# Patient Record
Sex: Female | Born: 1985 | Race: White | Hispanic: No | Marital: Married | Smoking: Never smoker
Health system: Southern US, Community
[De-identification: ages and names within clinical notes are randomized; demographics above are authoritative.]

## PROBLEM LIST (undated history)

## (undated) ENCOUNTER — Inpatient Hospital Stay (HOSPITAL_COMMUNITY): Payer: Self-pay

## (undated) DIAGNOSIS — J189 Pneumonia, unspecified organism: Secondary | ICD-10-CM

## (undated) DIAGNOSIS — Z8709 Personal history of other diseases of the respiratory system: Secondary | ICD-10-CM

## (undated) DIAGNOSIS — K219 Gastro-esophageal reflux disease without esophagitis: Secondary | ICD-10-CM

## (undated) DIAGNOSIS — Z8619 Personal history of other infectious and parasitic diseases: Secondary | ICD-10-CM

## (undated) HISTORY — DX: Personal history of other diseases of the respiratory system: Z87.09

## (undated) HISTORY — DX: Personal history of other infectious and parasitic diseases: Z86.19

---
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
## 2008-12-14 ENCOUNTER — Ambulatory Visit: Payer: Self-pay | Admitting: Obstetrics & Gynecology

## 2008-12-14 ENCOUNTER — Encounter: Payer: Self-pay | Admitting: Obstetrics & Gynecology

## 2009-09-07 ENCOUNTER — Ambulatory Visit: Payer: Self-pay | Admitting: Obstetrics and Gynecology

## 2009-11-29 ENCOUNTER — Ambulatory Visit: Payer: Self-pay | Admitting: Family Medicine

## 2009-11-30 ENCOUNTER — Encounter: Payer: Self-pay | Admitting: Family Medicine

## 2009-11-30 LAB — CONVERTED CEMR LAB: Clue Cells Wet Prep HPF POC: NONE SEEN

## 2010-01-23 ENCOUNTER — Ambulatory Visit: Payer: Self-pay | Admitting: Obstetrics and Gynecology

## 2010-02-19 ENCOUNTER — Ambulatory Visit: Payer: Self-pay | Admitting: Family Medicine

## 2011-03-12 NOTE — Assessment & Plan Note (Signed)
NAME:  [name], AGRO NO.:  [id number]   MEDICAL RECORD NO.:  [id number]          PATIENT TYPE:  POB   LOCATION:  [facility] at [facility]         FACILITY:  [facility]   PHYSICIAN:  Catalina Antigua, MD     DATE OF BIRTH:  04-12-1986   DATE OF SERVICE:  09/07/2009                                  CLINIC NOTE   This is a 25 year old, gravida 0 with LMP of October of 16, 2010, who  presents for evaluation of left breast tenderness since Monday.  The  patient denies any palpable masses or nipple discharge.  There was some  left breast tenderness that she noticed on Monday.  The patient is  currently using Ortho Tri-Cyclen lo for birth control and is at the end  of her pack for active pills.  The patient has a family history  significant for diabetes and high blood pressure, but denies any history  of breast or ovarian cancer or colon cancer in her family.   PHYSICAL EXAMINATION:  VITAL SIGNS:  Her blood pressure is 114/85, pulse  of 75, weight of 125 pounds, height of 5 feet 0 inches.  BREASTS:  Equal in size.  No palpable masses or tenderness.  No palpable  lymphadenopathy.  No expressible nipple discharge.  The patient pointed  the pain to be localized at the 7-9 o'clock region and on her left  breast in that particular region again no tenderness or palpable masses  were appreciated.   ASSESSMENT AND PLAN:  This is a 25 year old, gravida 0, who presents for  evaluation of left breast tenderness.  The patient was advised that her  pain may be due to her completing her pack of birth control pills.  The  patient is advised to return for evaluation after her period if the  breast tenderness is persistent.  The patient will otherwise followup in  February for annual exam.           ______________________________  Catalina Antigua, MD     PC/MEDQ  D:  09/07/2009  T:  09/08/2009  Job:  [id number]

## 2011-03-12 NOTE — Assessment & Plan Note (Signed)
NAME:  [name], TEAS NO.:  [id number]   MEDICAL RECORD NO.:  [id number]          PATIENT TYPE:  POB   LOCATION:  [facility] at [facility]         FACILITY:  [facility]   PHYSICIAN:  Tinnie Gens, MD        DATE OF BIRTH:  02/17/1986   DATE OF SERVICE:  11/29/2009                                  CLINIC NOTE   CHIEF COMPLAINT:  Vaginal discharge or itching.   HISTORY OF PRESENT ILLNESS:  The patient is a 25 year old nullipara, who  has recently married.  She has been on antibiotics for a sinus  infection.  She developed abnormal discharge and some vaginal irritation  and she started Monistat, it burned exceedingly.  It burns very badly,  so she stopped taking that medicine.  She thought maybe she had a UTI  and began taking cranberry juice and cranberry pills.  She continues to  have abdominal cramping, an abnormal discharge, and some burning with  urination, and she reports her urine has some odor in it.  She does not  do, should take baths, she denies back pain, fever, chills, nausea, or  vomiting.   PHYSICAL EXAMINATION:  VITAL SIGNS:  Today, her vitals are as in the  chart.  GENERAL:  She is a well-developed, well-nourished female, in no acute  distress.  ABDOMEN:  Soft, no suprapubic tenderness noted.  BACK:  Negative for CVA tenderness.  GU:  Normal external female genitalia.  BUS is normal.  Vagina is pink  and rugated.  Cervix is nulliparous.  There is a yellow discharge noted.  This discharge is thin and liquid without clumps.  She has no cervical  motion tenderness.  Uterus is small and anteverted.  No adnexal masses  or tenderness.   IMPRESSION:  Abnormal discharge likely bacterial vaginitis.   PLAN:  1. Treat with Flagyl 500 mg p.o. b.i.d.  Advised no alcohol during      this time.  UA shows trace amount of blood, we will send for      culture.  If this comes back possible, we call this in for her.  2. Send wet prep.     ______________________________  Tinnie Gens, MD     TP/MEDQ  D:  11/29/2009  T:  11/29/2009  Job:  [id number]

## 2011-03-12 NOTE — Assessment & Plan Note (Signed)
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  All tests at  that time were normal.  She is in for annual today.  She has no  significant complaints except an area where she said she saw a  gynecologist in [place] one time that they told her she had a cyst  that was painful in the lower portion of her vagina, mainly when she  walks.  Other than that, the review of systems is negative.   PHYSICAL EXAMINATION:  GENERAL:  Well-developed, well-nourished white  female, in no acute distress.  VITAL SIGNS:  Weight 141, 5 feet tall, blood pressure 115/89, pulse 91  per minute.  HEENT:  Within normal limits.  NECK:  Supple.  Thyroid symmetrical, no masses.  LUNGS:  Clear to auscultation and percussion.  HEART:  No murmur.  Normal sinus rhythm.  BREASTS:  Symmetrical, no dominant masses.  No nipple discharge.  No  supraclavicular or axillary nodes.  ABDOMEN:  Soft, flat, nontender.  No masses, no organomegaly.  GU:  External genitalia is normal.  BUS within normal limits.  No  palpable Bartholin cyst could be noted.  No swelling of the labia and no  point tenderness where the patient is describing the pain.  Vagina is  clean, well rugated.  Cervix is clean with a small ectropion.  The  uterus is anterior of normal size, shape, consistency, and adnexa is  normal.   IMPRESSION:  Normal gynecological examination.           ______________________________  Argentina Donovan, MD     PR/MEDQ  D:  01/23/2010  T:  01/24/2010  Job:  [id number]

## 2011-03-12 NOTE — Assessment & Plan Note (Signed)
NAMEDEBBE, [name] NO.:  [id number]   MEDICAL RECORD NO.:  [id number]          PATIENT TYPE:  POB   LOCATION:  [facility] at [facility]         FACILITY:  [facility]   PHYSICIAN:  Johnella Moloney, MD        DATE OF BIRTH:  [birth year]   DATE OF SERVICE:  12/14/2008                                  CLINIC NOTE   Patient is a 25 year old gravida 0 with last menstrual period of  December 07, 2008, who is here for annual exam.  Patient has no  gynecologic concerns.  She denies any abnormal bleeding, vaginal  discharge ,or problems with sexual intercourse.  Of note, patient is  going to get married on April 01, 2009.  She is on Ortho Tri-Cyclen Lo for  birth control and desires a refill of this medication.   PAST MEDICAL HISTORY:  Patient had pneumonia as a child for which she  was hospitalized.   PAST SURGICAL HISTORY:  None.   PAST GYN HISTORY:  Patient had menarche at age 55.  Her cycles are  regular with 28 days between cycles and her periods last for 5 days,  characterized by heavy flow.  It is sometimes associated with moderate  pain.  She denies any intermenstrual bleeding.  Patient is currently on  Ortho Tri-Cyclen Lo for birth control.  Her last Pap smear was in  November 2008 which was normal.  She has never had an abnormal Pap  smear.   MEDICATIONS:  Ortho Tri-Cyclen Lo.   ALLERGIES:  MENINGITIS VACCINE AND PATIENT REPORTS THAT SHE HAD AN  ANAPHYLACTIC REACTION TO THIS VACCINATION.   SOCIAL HISTORY:  Patient lives with her fiance.  She is currently  unemployed.  She drinks 1 to 2 alcoholic drinks per week.  She does not  smoke and she does not use any IV or addicting drugs.  She denies any  past or current history of sexual physical abuse.   FAMILIAL HISTORY:  Remarkable for diabetes and high blood pressure.  She  denies any breast, gynecologic, or colon cancer history.   REVIEW OF SYSTEMS:  Comprehensive 14-point review of systems was  reviewed and was  entirely negative.   PHYSICAL EXAMINATION:  Blood pressure 107/79.  Pulse 85.  Weight 139  pounds.  Height 5 feet.  GENERAL:  No apparent distress.  HEENT:  Normocephalic, atraumatic.  Normal thyroid.  No abnormal neck  masses.  LUNGS:  Clear to auscultation bilaterally.  HEART:  Regular rate and rhythm.  BREASTS:  Symmetric in size, nontender.  No abnormal masses, skin  changes, drainage, or lymphadenopathy palpated.  ABDOMEN:  Soft, nontender, nondistended.  EXTREMITIES:  No cyanosis, clubbing, or edema.  PELVIC:  Normal external female genitalia.  Pink, well-rugated vagina.  Normal cervix contour.  Pap smear was obtained.  On bimanual exam,  uterus is mobile, normal size, nontender.  Adnexa normal bilaterally  without tenderness to palpation.   ASSESSMENT AND PLAN:  Patient is a 25 year old gravida 0 here for her  annual gynecologic exam and Pap smear.  She had a normal breast exam  today and a Pap smear was obtained.  Of note, gonorrhea and  chlamydia  will be run off of the Pap smear sample.  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  She has said that her former gynecologist also talked to  her about this.  Patient will be contacted with the Pap smear results  and she was given best wishes for her upcoming wedding in June and told  to follow up for any gynecologic concerns.           ______________________________  Johnella Moloney, MD     UD/MEDQ  D:  12/14/2008  T:  12/14/2008  Job:  [id number]

## 2011-05-23 ENCOUNTER — Other Ambulatory Visit: Payer: Self-pay | Admitting: Obstetrics and Gynecology

## 2011-05-30 LAB — HIV ANTIBODY (ROUTINE TESTING W REFLEX): HIV: NONREACTIVE

## 2011-05-30 LAB — RPR: RPR: NONREACTIVE

## 2011-05-30 LAB — HEPATITIS B SURFACE ANTIGEN: Hepatitis B Surface Ag: NEGATIVE

## 2011-05-30 LAB — ABO/RH

## 2011-05-30 LAB — GC/CHLAMYDIA PROBE AMP, GENITAL: Gonorrhea: NEGATIVE

## 2011-05-30 LAB — ANTIBODY SCREEN: Antibody Screen: NEGATIVE

## 2011-12-27 LAB — STREP B DNA PROBE: GBS: NEGATIVE

## 2011-12-27 LAB — CULTURE, BETA STREP (GROUP B ONLY): Organism ID, Bacteria: NEGATIVE

## 2012-01-23 ENCOUNTER — Encounter (HOSPITAL_COMMUNITY): Payer: Self-pay | Admitting: Anesthesiology

## 2012-01-23 ENCOUNTER — Encounter (HOSPITAL_COMMUNITY)
Admission: AD | Disposition: A | Discharge status: Home / Self Care | Payer: Self-pay | Source: Ambulatory Visit | Attending: Obstetrics and Gynecology

## 2012-01-23 ENCOUNTER — Inpatient Hospital Stay (HOSPITAL_COMMUNITY): Payer: BC Managed Care – PPO | Admitting: Anesthesiology

## 2012-01-23 ENCOUNTER — Inpatient Hospital Stay (HOSPITAL_COMMUNITY)
Admission: AD | Admit: 2012-01-23 | Discharge: 2012-01-26 | DRG: 371 | Disposition: A | Discharge status: Home / Self Care | Payer: BC Managed Care – PPO | Source: Ambulatory Visit | Attending: Obstetrics and Gynecology | Admitting: Obstetrics and Gynecology

## 2012-01-23 ENCOUNTER — Encounter (HOSPITAL_COMMUNITY): Payer: Self-pay | Admitting: *Deleted

## 2012-01-23 DIAGNOSIS — O324XX0 Maternal care for high head at term, not applicable or unspecified: Secondary | ICD-10-CM | POA: Diagnosis present

## 2012-01-23 DIAGNOSIS — O480 Post-term pregnancy: Principal | ICD-10-CM | POA: Diagnosis present

## 2012-01-23 HISTORY — DX: Gastro-esophageal reflux disease without esophagitis: K21.9

## 2012-01-23 HISTORY — DX: Pneumonia, unspecified organism: J18.9

## 2012-01-23 LAB — CBC
HCT: 41.7 % (ref 36.0–46.0)
MCV: 94.3 fL (ref 78.0–100.0)
Platelets: 191 10*3/uL (ref 150–400)
RBC: 4.42 MIL/uL (ref 3.87–5.11)
WBC: 11.3 10*3/uL — ABNORMAL HIGH (ref 4.0–10.5)

## 2012-01-23 SURGERY — Surgical Case
Anesthesia: Regional

## 2012-01-23 MED ORDER — DIPHENHYDRAMINE HCL 12.5 MG/5ML PO ELIX
12.5000 mg | ORAL_SOLUTION | Freq: Four times a day (QID) | ORAL | Status: DC | PRN
  Filled 2012-01-23: qty 5

## 2012-01-23 MED ORDER — PHENYLEPHRINE 40 MCG/ML (10ML) SYRINGE FOR IV PUSH (FOR BLOOD PRESSURE SUPPORT): 80.0000 ug | PREFILLED_SYRINGE | INTRAVENOUS | Status: DC | PRN

## 2012-01-23 MED ORDER — MORPHINE SULFATE (PF) 0.5 MG/ML IJ SOLN
INTRAMUSCULAR | Status: DC | PRN
  Administered 2012-01-23: 3 mg via EPIDURAL

## 2012-01-23 MED ORDER — CHLOROPROCAINE HCL 3 % IJ SOLN
INTRAMUSCULAR | Status: DC | PRN
  Administered 2012-01-23: 20 mL

## 2012-01-23 MED ORDER — ONDANSETRON HCL 4 MG/2ML IJ SOLN: 4.0000 mg | Freq: Four times a day (QID) | INTRAMUSCULAR | Status: DC | PRN

## 2012-01-23 MED ORDER — ONDANSETRON HCL 4 MG PO TABS: 4.0000 mg | ORAL_TABLET | ORAL | Status: DC | PRN

## 2012-01-23 MED ORDER — EPHEDRINE 5 MG/ML INJ
INTRAVENOUS | Status: AC
  Filled 2012-01-23: qty 10

## 2012-01-23 MED ORDER — SODIUM CHLORIDE 0.9 % IJ SOLN
3.0000 mL | INTRAMUSCULAR | Status: DC | PRN
  Administered 2012-01-24: 3 mL via INTRAVENOUS

## 2012-01-23 MED ORDER — FENTANYL CITRATE 0.05 MG/ML IJ SOLN: 25.0000 ug | INTRAMUSCULAR | Status: DC | PRN

## 2012-01-23 MED ORDER — OXYTOCIN 10 UNIT/ML IJ SOLN
INTRAMUSCULAR | Status: AC
  Filled 2012-01-23: qty 2

## 2012-01-23 MED ORDER — KETOROLAC TROMETHAMINE 30 MG/ML IJ SOLN: 30.0000 mg | Freq: Four times a day (QID) | INTRAMUSCULAR | Status: AC | PRN

## 2012-01-23 MED ORDER — MIDAZOLAM HCL 2 MG/2ML IJ SOLN: 0.5000 mg | Freq: Once | INTRAMUSCULAR | Status: DC | PRN

## 2012-01-23 MED ORDER — TERBUTALINE SULFATE 1 MG/ML IJ SOLN: 0.2500 mg | Freq: Once | INTRAMUSCULAR | Status: DC | PRN

## 2012-01-23 MED ORDER — TETANUS-DIPHTH-ACELL PERTUSSIS 5-2.5-18.5 LF-MCG/0.5 IM SUSP: 0.5000 mL | Freq: Once | INTRAMUSCULAR | Status: DC

## 2012-01-23 MED ORDER — LACTATED RINGERS IV SOLN
INTRAVENOUS | Status: DC
  Administered 2012-01-24 (×2): via INTRAVENOUS

## 2012-01-23 MED ORDER — METOCLOPRAMIDE HCL 5 MG/ML IJ SOLN: 10.0000 mg | Freq: Three times a day (TID) | INTRAMUSCULAR | Status: DC | PRN

## 2012-01-23 MED ORDER — LIDOCAINE-EPINEPHRINE (PF) 2 %-1:200000 IJ SOLN
INTRAMUSCULAR | Status: AC
  Filled 2012-01-23: qty 20

## 2012-01-23 MED ORDER — NALOXONE HCL 0.4 MG/ML IJ SOLN: 0.4000 mg | INTRAMUSCULAR | Status: DC | PRN

## 2012-01-23 MED ORDER — SIMETHICONE 80 MG PO CHEW
80.0000 mg | CHEWABLE_TABLET | Freq: Three times a day (TID) | ORAL | Status: DC
  Administered 2012-01-24 – 2012-01-26 (×9): 80 mg via ORAL

## 2012-01-23 MED ORDER — FLEET ENEMA 7-19 GM/118ML RE ENEM: 1.0000 | ENEMA | RECTAL | Status: DC | PRN

## 2012-01-23 MED ORDER — DIPHENHYDRAMINE HCL 25 MG PO CAPS: 25.0000 mg | ORAL_CAPSULE | Freq: Four times a day (QID) | ORAL | Status: DC | PRN

## 2012-01-23 MED ORDER — MENTHOL 3 MG MT LOZG: 1.0000 | LOZENGE | OROMUCOSAL | Status: DC | PRN

## 2012-01-23 MED ORDER — NALBUPHINE HCL 10 MG/ML IJ SOLN: 5.0000 mg | INTRAMUSCULAR | Status: DC | PRN

## 2012-01-23 MED ORDER — SODIUM CHLORIDE 0.9 % IV SOLN: 1.0000 ug/kg/h | INTRAVENOUS | Status: DC | PRN

## 2012-01-23 MED ORDER — ONDANSETRON HCL 4 MG/2ML IJ SOLN
INTRAMUSCULAR | Status: AC
  Filled 2012-01-23: qty 2

## 2012-01-23 MED ORDER — SIMETHICONE 80 MG PO CHEW: 80.0000 mg | CHEWABLE_TABLET | ORAL | Status: DC | PRN

## 2012-01-23 MED ORDER — CHLOROPROCAINE HCL 3 % IJ SOLN
INTRAMUSCULAR | Status: AC
  Filled 2012-01-23: qty 20

## 2012-01-23 MED ORDER — PHENYLEPHRINE HCL 10 MG/ML IJ SOLN
INTRAMUSCULAR | Status: DC | PRN
  Administered 2012-01-23 (×2): 40 ug via INTRAVENOUS
  Administered 2012-01-23: 60 ug via INTRAVENOUS

## 2012-01-23 MED ORDER — DIPHENHYDRAMINE HCL 25 MG PO CAPS: 25.0000 mg | ORAL_CAPSULE | ORAL | Status: DC | PRN

## 2012-01-23 MED ORDER — FENTANYL 2.5 MCG/ML BUPIVACAINE 1/10 % EPIDURAL INFUSION (WH - ANES)
INTRAMUSCULAR | Status: DC | PRN
  Administered 2012-01-23: 14 mL/h via EPIDURAL

## 2012-01-23 MED ORDER — WITCH HAZEL-GLYCERIN EX PADS: 1.0000 "application " | MEDICATED_PAD | CUTANEOUS | Status: DC | PRN

## 2012-01-23 MED ORDER — ZOLPIDEM TARTRATE 5 MG PO TABS: 5.0000 mg | ORAL_TABLET | Freq: Every evening | ORAL | Status: DC | PRN

## 2012-01-23 MED ORDER — LANOLIN HYDROUS EX OINT: 1.0000 "application " | TOPICAL_OINTMENT | CUTANEOUS | Status: DC | PRN

## 2012-01-23 MED ORDER — OXYTOCIN 20 UNITS IN LACTATED RINGERS INFUSION - SIMPLE
1.0000 m[IU]/min | INTRAVENOUS | Status: DC
  Administered 2012-01-23: 2 m[IU]/min via INTRAVENOUS
  Filled 2012-01-23: qty 1000

## 2012-01-23 MED ORDER — PHENYLEPHRINE 40 MCG/ML (10ML) SYRINGE FOR IV PUSH (FOR BLOOD PRESSURE SUPPORT)
PREFILLED_SYRINGE | INTRAVENOUS | Status: AC
  Filled 2012-01-23: qty 5

## 2012-01-23 MED ORDER — IBUPROFEN 600 MG PO TABS
600.0000 mg | ORAL_TABLET | Freq: Four times a day (QID) | ORAL | Status: DC
  Administered 2012-01-24 – 2012-01-26 (×9): 600 mg via ORAL
  Filled 2012-01-23 (×8): qty 1

## 2012-01-23 MED ORDER — CITRIC ACID-SODIUM CITRATE 334-500 MG/5ML PO SOLN
30.0000 mL | ORAL | Status: DC | PRN
  Administered 2012-01-23: 30 mL via ORAL
  Filled 2012-01-23: qty 15

## 2012-01-23 MED ORDER — MEPERIDINE HCL 25 MG/ML IJ SOLN
INTRAMUSCULAR | Status: DC | PRN
  Administered 2012-01-23: 25 mg via INTRAVENOUS

## 2012-01-23 MED ORDER — KETOROLAC TROMETHAMINE 30 MG/ML IJ SOLN
INTRAMUSCULAR | Status: AC
  Filled 2012-01-23: qty 1

## 2012-01-23 MED ORDER — MEPERIDINE HCL 25 MG/ML IJ SOLN
INTRAMUSCULAR | Status: AC
  Filled 2012-01-23: qty 1

## 2012-01-23 MED ORDER — PROMETHAZINE HCL 25 MG/ML IJ SOLN: 6.2500 mg | INTRAMUSCULAR | Status: DC | PRN

## 2012-01-23 MED ORDER — SODIUM BICARBONATE 8.4 % IV SOLN
INTRAVENOUS | Status: DC | PRN
  Administered 2012-01-23: 4 mL via EPIDURAL

## 2012-01-23 MED ORDER — ACETAMINOPHEN 325 MG PO TABS: 325.0000 mg | ORAL_TABLET | ORAL | Status: DC | PRN

## 2012-01-23 MED ORDER — DIPHENHYDRAMINE HCL 50 MG/ML IJ SOLN: 12.5000 mg | INTRAMUSCULAR | Status: DC | PRN

## 2012-01-23 MED ORDER — IBUPROFEN 600 MG PO TABS: 600.0000 mg | ORAL_TABLET | Freq: Four times a day (QID) | ORAL | Status: DC | PRN

## 2012-01-23 MED ORDER — MEPERIDINE HCL 25 MG/ML IJ SOLN: 6.2500 mg | INTRAMUSCULAR | Status: DC | PRN

## 2012-01-23 MED ORDER — MORPHINE SULFATE (PF) 0.5 MG/ML IJ SOLN
INTRAMUSCULAR | Status: DC | PRN
  Administered 2012-01-23: 2 mg via EPIDURAL

## 2012-01-23 MED ORDER — DIBUCAINE 1 % RE OINT: 1.0000 "application " | TOPICAL_OINTMENT | RECTAL | Status: DC | PRN

## 2012-01-23 MED ORDER — OXYTOCIN 20 UNITS IN LACTATED RINGERS INFUSION - SIMPLE: 125.0000 mL/h | Freq: Once | INTRAVENOUS | Status: DC

## 2012-01-23 MED ORDER — MORPHINE SULFATE 0.5 MG/ML IJ SOLN
INTRAMUSCULAR | Status: AC
  Filled 2012-01-23: qty 10

## 2012-01-23 MED ORDER — OXYCODONE-ACETAMINOPHEN 5-325 MG PO TABS
1.0000 | ORAL_TABLET | ORAL | Status: DC | PRN
  Administered 2012-01-24 (×2): 2 via ORAL
  Administered 2012-01-24: 1 via ORAL
  Administered 2012-01-25 (×2): 2 via ORAL
  Administered 2012-01-26: 1 via ORAL
  Administered 2012-01-26: 2 via ORAL
  Administered 2012-01-26: 1 via ORAL
  Filled 2012-01-23 (×2): qty 2
  Filled 2012-01-23: qty 1
  Filled 2012-01-23 (×3): qty 2
  Filled 2012-01-23 (×2): qty 1

## 2012-01-23 MED ORDER — OXYTOCIN 20 UNITS IN LACTATED RINGERS INFUSION - SIMPLE: 125.0000 mL/h | INTRAVENOUS | Status: AC

## 2012-01-23 MED ORDER — SCOPOLAMINE 1 MG/3DAYS TD PT72
1.0000 | MEDICATED_PATCH | Freq: Once | TRANSDERMAL | Status: DC
  Administered 2012-01-23: 1.5 mg via TRANSDERMAL

## 2012-01-23 MED ORDER — LIDOCAINE HCL (PF) 1 % IJ SOLN: 30.0000 mL | INTRAMUSCULAR | Status: DC | PRN

## 2012-01-23 MED ORDER — LACTATED RINGERS IV SOLN
INTRAVENOUS | Status: DC | PRN
  Administered 2012-01-23: 20:00:00 via INTRAVENOUS

## 2012-01-23 MED ORDER — FENTANYL 2.5 MCG/ML BUPIVACAINE 1/10 % EPIDURAL INFUSION (WH - ANES)
14.0000 mL/h | INTRAMUSCULAR | Status: DC
  Administered 2012-01-23: 14 mL/h via EPIDURAL
  Filled 2012-01-23 (×2): qty 60

## 2012-01-23 MED ORDER — ONDANSETRON HCL 4 MG/2ML IJ SOLN: 4.0000 mg | Freq: Three times a day (TID) | INTRAMUSCULAR | Status: DC | PRN

## 2012-01-23 MED ORDER — ACETAMINOPHEN 325 MG PO TABS: 650.0000 mg | ORAL_TABLET | ORAL | Status: DC | PRN

## 2012-01-23 MED ORDER — EPHEDRINE 5 MG/ML INJ
10.0000 mg | INTRAVENOUS | Status: DC | PRN
  Filled 2012-01-23: qty 4

## 2012-01-23 MED ORDER — DIPHENHYDRAMINE HCL 50 MG/ML IJ SOLN: 25.0000 mg | INTRAMUSCULAR | Status: DC | PRN

## 2012-01-23 MED ORDER — OXYCODONE-ACETAMINOPHEN 5-325 MG PO TABS: 1.0000 | ORAL_TABLET | ORAL | Status: DC | PRN

## 2012-01-23 MED ORDER — OXYTOCIN 20 UNITS IN LACTATED RINGERS INFUSION - SIMPLE
INTRAVENOUS | Status: DC | PRN
  Administered 2012-01-23 (×2): 20 [IU] via INTRAVENOUS

## 2012-01-23 MED ORDER — ONDANSETRON HCL 4 MG/2ML IJ SOLN: 4.0000 mg | INTRAMUSCULAR | Status: DC | PRN

## 2012-01-23 MED ORDER — LACTATED RINGERS IV SOLN
500.0000 mL | INTRAVENOUS | Status: DC | PRN
  Administered 2012-01-23: 1000 mL via INTRAVENOUS

## 2012-01-23 MED ORDER — ONDANSETRON HCL 4 MG/2ML IJ SOLN
INTRAMUSCULAR | Status: DC | PRN
  Administered 2012-01-23: 4 mg via INTRAVENOUS

## 2012-01-23 MED ORDER — CEFAZOLIN SODIUM-DEXTROSE 2-3 GM-% IV SOLR
2.0000 g | Freq: Once | INTRAVENOUS | Status: AC
  Administered 2012-01-23: 2 g via INTRAVENOUS
  Filled 2012-01-23: qty 50

## 2012-01-23 MED ORDER — LACTATED RINGERS IV SOLN: 500.0000 mL | Freq: Once | INTRAVENOUS | Status: DC

## 2012-01-23 MED ORDER — LACTATED RINGERS IV SOLN
INTRAVENOUS | Status: DC
  Administered 2012-01-23 (×3): via INTRAVENOUS

## 2012-01-23 MED ORDER — SCOPOLAMINE 1 MG/3DAYS TD PT72
MEDICATED_PATCH | TRANSDERMAL | Status: AC
  Filled 2012-01-23: qty 1

## 2012-01-23 MED ORDER — EPHEDRINE 5 MG/ML INJ: 10.0000 mg | INTRAVENOUS | Status: DC | PRN

## 2012-01-23 MED ORDER — DIPHENHYDRAMINE HCL 50 MG/ML IJ SOLN: 12.5000 mg | Freq: Four times a day (QID) | INTRAMUSCULAR | Status: DC | PRN

## 2012-01-23 MED ORDER — PHENYLEPHRINE 40 MCG/ML (10ML) SYRINGE FOR IV PUSH (FOR BLOOD PRESSURE SUPPORT)
80.0000 ug | PREFILLED_SYRINGE | INTRAVENOUS | Status: DC | PRN
  Filled 2012-01-23: qty 5

## 2012-01-23 MED ORDER — SODIUM CHLORIDE 0.9 % IJ SOLN: 9.0000 mL | INTRAMUSCULAR | Status: DC | PRN

## 2012-01-23 MED ORDER — ACETAMINOPHEN 10 MG/ML IV SOLN: 1000.0000 mg | Freq: Four times a day (QID) | INTRAVENOUS | Status: AC | PRN

## 2012-01-23 MED ORDER — SENNOSIDES-DOCUSATE SODIUM 8.6-50 MG PO TABS
2.0000 | ORAL_TABLET | Freq: Every day | ORAL | Status: DC
  Administered 2012-01-24: 2 via ORAL

## 2012-01-23 MED ORDER — KETOROLAC TROMETHAMINE 30 MG/ML IJ SOLN
30.0000 mg | Freq: Four times a day (QID) | INTRAMUSCULAR | Status: AC | PRN
  Administered 2012-01-23 – 2012-01-24 (×2): 30 mg via INTRAVENOUS
  Filled 2012-01-23: qty 1

## 2012-01-23 MED ORDER — OXYTOCIN BOLUS FROM INFUSION
500.0000 mL | Freq: Once | INTRAVENOUS | Status: DC
  Filled 2012-01-23: qty 500

## 2012-01-23 MED ORDER — PRENATAL MULTIVITAMIN CH
1.0000 | ORAL_TABLET | Freq: Every day | ORAL | Status: DC
  Administered 2012-01-24 – 2012-01-26 (×3): 1 via ORAL
  Filled 2012-01-23 (×3): qty 1

## 2012-01-23 SURGICAL SUPPLY — 33 items
CLOTH BEACON ORANGE TIMEOUT ST (SAFETY) ×2 IMPLANT
DERMABOND ADVANCED (GAUZE/BANDAGES/DRESSINGS) ×2
DERMABOND ADVANCED .7 DNX12 (GAUZE/BANDAGES/DRESSINGS) ×2 IMPLANT
DRESSING TELFA 8X3 (GAUZE/BANDAGES/DRESSINGS) ×2 IMPLANT
DRSG COVADERM 4X10 (GAUZE/BANDAGES/DRESSINGS) ×2 IMPLANT
ELECT REM PT RETURN 9FT ADLT (ELECTROSURGICAL) ×2
ELECTRODE REM PT RTRN 9FT ADLT (ELECTROSURGICAL) ×1 IMPLANT
EXTRACTOR VACUUM M CUP 4 TUBE (SUCTIONS) IMPLANT
GAUZE SPONGE 4X4 12PLY STRL LF (GAUZE/BANDAGES/DRESSINGS) ×4 IMPLANT
GLOVE BIO SURGEON STRL SZ7 (GLOVE) ×4 IMPLANT
GLOVE BIOGEL PI IND STRL 7.5 (GLOVE) ×1 IMPLANT
GLOVE BIOGEL PI INDICATOR 7.5 (GLOVE) ×1
GLOVE SURG SS PI 7.5 STRL IVOR (GLOVE) ×6 IMPLANT
GOWN PREVENTION PLUS LG XLONG (DISPOSABLE) ×10 IMPLANT
KIT ABG SYR 3ML LUER SLIP (SYRINGE) IMPLANT
NEEDLE HYPO 25X5/8 SAFETYGLIDE (NEEDLE) IMPLANT
NS IRRIG 1000ML POUR BTL (IV SOLUTION) ×2 IMPLANT
PACK C SECTION WH (CUSTOM PROCEDURE TRAY) ×2 IMPLANT
PAD ABD 7.5X8 STRL (GAUZE/BANDAGES/DRESSINGS) ×2 IMPLANT
RTRCTR C-SECT PINK 25CM LRG (MISCELLANEOUS) ×2 IMPLANT
RTRCTR C-SECT PINK 34CM XLRG (MISCELLANEOUS) ×2 IMPLANT
SLEEVE SCD COMPRESS KNEE MED (MISCELLANEOUS) ×2 IMPLANT
SPONGE LAP 18X18 X RAY DECT (DISPOSABLE) ×2 IMPLANT
STAPLER VISISTAT 35W (STAPLE) IMPLANT
SUT CHROMIC 1 CTX 36 (SUTURE) ×6 IMPLANT
SUT PDS AB 0 CTX 60 (SUTURE) ×4 IMPLANT
SUT PLAIN 2 0 XLH (SUTURE) ×2 IMPLANT
SUT VIC AB 2-0 CT1 27 (SUTURE) ×1
SUT VIC AB 2-0 CT1 TAPERPNT 27 (SUTURE) ×1 IMPLANT
SUT VIC AB 4-0 KS 27 (SUTURE) ×2 IMPLANT
TOWEL OR 17X24 6PK STRL BLUE (TOWEL DISPOSABLE) ×4 IMPLANT
TRAY FOLEY CATH 14FR (SET/KITS/TRAYS/PACK) IMPLANT
WATER STERILE IRR 1000ML POUR (IV SOLUTION) ×2 IMPLANT

## 2012-01-23 NOTE — Anesthesia Preprocedure Evaluation (Signed)
Anesthesia Evaluation  Patient identified by MRN, date of birth, ID band Patient awake    Reviewed: Allergy & Precautions, H&P , Patient's Chart, lab work & pertinent test results  Airway Mallampati: II TM Distance: >3 FB Neck ROM: full    Dental  (+) Teeth Intact   Pulmonary  breath sounds clear to auscultation        Cardiovascular Rhythm:regular Rate:Normal     Neuro/Psych    GI/Hepatic GERD-  Medicated and Controlled,  Endo/Other  Morbid obesity  Renal/GU      Musculoskeletal   Abdominal   Peds  Hematology   Anesthesia Other Findings       Reproductive/Obstetrics (+) Pregnancy                           Anesthesia Physical Anesthesia Plan  ASA: III  Anesthesia Plan: Epidural   Post-op Pain Management:    Induction:   Airway Management Planned:   Additional Equipment:   Intra-op Plan:   Post-operative Plan:   Informed Consent: I have reviewed the patients History and Physical, chart, labs and discussed the procedure including the risks, benefits and alternatives for the proposed anesthesia with the patient or authorized representative who has indicated his/her understanding and acceptance.   Dental Advisory Given  Plan Discussed with:   Anesthesia Plan Comments: (Labs checked- platelets confirmed with RN in room. Fetal heart tracing, per RN, reported to be stable enough for sitting procedure. Discussed epidural, and patient consents to the procedure:  included risk of possible headache,backache, failed block, allergic reaction, and nerve injury. This patient was asked if she had any questions or concerns before the procedure started. )        Anesthesia Quick Evaluation

## 2012-01-23 NOTE — Op Note (Signed)
Pre-Operative Diagnosis: 1) 40+3 week IUP 2) Failure to descend Postoperative Diagnosis: Same Procedure: Primary Low Transverse Cesarean Section Surgeon: Dr. Waynard Reeds Assistant: None Operative Findings: Vigorous female infant in the vertex presentation with apgars of 9&9. Weight was not available at the time of dictation.  Ovaries and tubes were poorly visualized due to patient discomfort with manipulation. Specimen: Placenta EBL: Total I/O In: 2000 [I.V.:2000] Out: 1600 [Urine:200; Blood:1400]   Procedure:[name] is an 26 year old gravida 1 para 0 at 40 weeks and 3 days estimated gestational age who presents for cesarean section. The patient was admitted for a postdates induction of labor.  She was 4-5 cm dilated on admission. Once amniotomy was performed she proceeded quickly to complete dilation.  With pushing she developed severe vaginal and vulvar swelling with no descent of the fetal vertex and development of significant caput. The patient also developed severe vaginal pain at the hymenal ring were her swelling was the worst. The findings were discussed with the patient, management options were reviewed, and the decision was made to proceed with cesarean section.  Following the appropriate informed consent the patient was brought to the operating room where spinal anesthesia was administered and found to be adequate. The patient was appropriately identified during a time-out procedure. She was placed in the dorsal supine position with a leftward tilt. She was prepped and draped in the normal sterile fashion. Scalpel was then used to make a Pfannenstiel skin incision which was carried down to the underlying layers of soft tissue to the fascia. The fascia was incised in the midline and the fascial incision was extended laterally with Mayo scissors. The superior aspect of the fascial incision was grasped with Coker clamps x2, tented up and the rectus muscles dissected off sharply with the  electrocautery unit area and the same procedure was repeated on the inferior aspect of the fascial incision. The rectus muscles were separated in the midline. The abdominal peritoneum was identified, tented up, entered sharply, and the incision was extended superiorly and inferiorly with good visualization of the bladder. The Alexis retractor was then deployed. The vesicouterine peritoneum was identified, tented up, entered sharply, and the bladder flap was created digitally. Scalpel was then used to make a low transverse incision on the uterus which was extended laterally with both blunt dissection and the bandage scissors. The fetal vertex was identified, delivered easily through the uterine incision followed by the body. The infant was bulb suctioned on the operative field cried vigorously, cord was clamped and cut and the infant was passed to the waiting neonatologist. Placenta was then delivered spontaneously, the uterus was cleared of all clot and debris. The uterine incision was repaired with #1 chromic in running locked fashion followed by a second imbricating layer. The Alexis retractor was removed. The abdominal peritoneum was reapproximated with 2-0 Vicryl in a running fashion, the rectus muscles was reapproximated with #1 chromic in a running fashion. The fascia was closed with a looped PDS in a running fashion. The subcutaneous layer was reapproximated with 2-0 plain gut with interrupted suture. The skin was closed with 4-0 vicryl in a subcuticular fashion and dermabond. All sponge lap and needle counts were correct x2. Patient tolerated the procedure well and recovered in stable condition following the procedure.

## 2012-01-23 NOTE — Anesthesia Procedure Notes (Signed)

## 2012-01-23 NOTE — Progress Notes (Signed)
Patient ID: [name], female   DOB: Feb 12, 1986, 26 y.o.   MRN: [id number]  S: Comfortable, feeling only the ocassional contraction O: AFVSS FHT 140-150 reactive with accels cvx 4-5/80/-2 toco q2-3  AROM clear fluid  A/P 1) FWB reassuring 2) Continue pit

## 2012-01-23 NOTE — Anesthesia Postprocedure Evaluation (Addendum)
Anesthesia Post Note  Patient: [name]  Procedure(s) Performed: Procedure(s) (LRB): CESAREAN SECTION (N/A)  Anesthesia type: Epidural  Patient location: PACU  Post pain: Pain level controlled  Post assessment: Post-op Vital signs reviewed  Last Vitals:  Filed Vitals:   01/23/12 1843  BP: 111/69  Pulse: 96  Temp:   Resp: 20    Post vital signs: Reviewed  Level of consciousness: awake  Complications: No apparent anesthesia complications

## 2012-01-23 NOTE — Brief Op Note (Signed)
01/23/2012  8:10 PM  PATIENT:  [name]  25 y.o. female  PRE-OPERATIVE DIAGNOSIS:  failure to descend  POST-OPERATIVE DIAGNOSIS:  failure to descend  PROCEDURE:  Procedure(s) (LRB): CESAREAN SECTION (N/A)  SURGEON:  Surgeon(s) and Role:    * Tyrome Donatelli H. Tenny Craw, MD - Primary  PHYSICIAN ASSISTANT:   ASSISTANTS: none   ANESTHESIA:   local and epidural  EBL:  Total I/O In: 2000 [I.V.:2000] Out: 1600 [Urine:200; Blood:1400]  BLOOD ADMINISTERED:none  DRAINS: Urinary Catheter (Foley)   LOCAL MEDICATIONS USED:  OTHER nesacaine  SPECIMEN:  Source of Specimen:  Placenta  DISPOSITION OF SPECIMEN:  L&D for disposal  COUNTS:  YES  TOURNIQUET:  * No tourniquets in log *  DICTATION: .Dragon Dictation  PLAN OF CARE: Admit to inpatient   PATIENT DISPOSITION:  PACU - hemodynamically stable.   Delay start of Pharmacological VTE agent (>24hrs) due to surgical blood loss or risk of bleeding: not applicable

## 2012-01-23 NOTE — H&P (Signed)
  Admission H&P  CC: IOL HPI: 26 yo G1P0 at 40+3 admitted for IOL for postdates and advanced cervical dilation.  Pt was seen in the office yesterday and felt to be 4-5cm dilated.  Her pregnancy has been uncomplicated.  PMH: None PSH: Wisdom teeth POB: G1P0 Meds: PNV All: Meningococcal vaccine SH: neg x 3 PE:  Filed Vitals:   01/23/12 0813 01/23/12 1032  BP: 125/71 111/60  Pulse: 96 86  Temp: 98.2 F (36.8 C)   TempSrc: Oral   Resp: 20 20  Height: 5' (1.524 m)   Weight: 83.915 kg (185 lb)    AOX3, NAD Gravid soft NT NEFG cvx 4-5/80/-2 toco irreg FHT Q2-4  A/P 1) Admit 2) Pitocin 3) AROM when able 4) Epidural on request

## 2012-01-23 NOTE — Transfer of Care (Signed)
Immediate Anesthesia Transfer of Care Note  Patient: [name]  Procedure(s) Performed: Procedure(s) (LRB): CESAREAN SECTION (N/A)  Patient Location: PACU  Anesthesia Type: Epidural  Level of Consciousness: awake, alert  and oriented  Airway & Oxygen Therapy: Patient Spontanous Breathing  Post-op Assessment: Report given to PACU RN and Post -op Vital signs reviewed and stable  Post vital signs: Reviewed and stable  Complications: No apparent anesthesia complications

## 2012-01-24 LAB — CBC
HCT: 26.2 % — ABNORMAL LOW (ref 36.0–46.0)
MCH: 32.1 pg (ref 26.0–34.0)
MCV: 94.6 fL (ref 78.0–100.0)
RBC: 2.77 MIL/uL — ABNORMAL LOW (ref 3.87–5.11)
WBC: 13.1 10*3/uL — ABNORMAL HIGH (ref 4.0–10.5)

## 2012-01-24 NOTE — Progress Notes (Signed)
Post Op Day 1 Subjective: no complaints  Objective: Blood pressure 98/61, pulse 72, temperature 98.6 F (37 C), temperature source Oral, resp. rate 16, height 5' (1.524 m), weight 185 lb (83.915 kg), last menstrual period 03/29/2011, SpO2 97.00%, unknown if currently breastfeeding.  Physical Exam:  General: alert Lochia: appropriate Uterine Fundus: firm Incision: no significant drainage   Basename 01/24/12 0513 01/23/12 0815  HGB 8.9* 14.3  HCT 26.2* 41.7    Assessment/Plan: Observation   LOS: 1 day   [name] D 01/24/2012, 10:51 AM

## 2012-01-24 NOTE — Anesthesia Postprocedure Evaluation (Signed)
Anesthesia Post Note  Patient: [name]  Procedure(s) Performed: Procedure(s) (LRB): CESAREAN SECTION (N/A)  Anesthesia type: Epidural  Patient location: Mother/Baby  Post pain: Pain level controlled  Post assessment: Post-op Vital signs reviewed  Last Vitals:  Filed Vitals:   01/24/12 0843  BP: 98/61  Pulse: 72  Temp: 37 C  Resp: 16    Post vital signs: Reviewed  Level of consciousness: awake  Complications: No apparent anesthesia complications

## 2012-01-24 NOTE — Addendum Note (Signed)
Addendum  created 01/24/12 1012 by Jhonnie Garner, CRNA   Modules edited:Notes Section

## 2012-01-25 ENCOUNTER — Encounter (HOSPITAL_COMMUNITY): Payer: Self-pay | Admitting: Obstetrics and Gynecology

## 2012-01-25 NOTE — Progress Notes (Signed)
Post Op Day 2 Subjective: no complaints, up ad lib, voiding, tolerating PO and + flatus  Objective: Blood pressure 92/57, pulse 77, temperature 98 F (36.7 C),  breastfeeding.  Physical Exam:  General: alert Lochia: appropriate Uterine Fundus: firm Incision: healing well   Basename 01/24/12 0513 01/23/12 0815  HGB 8.9* 14.3  HCT 26.2* 41.7    Assessment/Plan: Plan for discharge tomorrow   LOS: 2 days   [name] D 01/25/2012, 9:08 AM

## 2012-01-26 MED ORDER — OXYCODONE-ACETAMINOPHEN 5-325 MG PO TABS: 1.0000 | ORAL_TABLET | ORAL | Status: AC | PRN

## 2012-01-26 NOTE — Discharge Summary (Signed)
Obstetric Discharge Summary Reason for Admission: induction of labor Prenatal Procedures: ultrasound Intrapartum Procedures: cesarean: low cervical, transverse Postpartum Procedures: none Complications-Operative and Postpartum: Cat. 2 FHT tracing, second stage arrest Hemoglobin  Date Value Range Status  01/24/2012 8.9* 12.0-15.0 (g/dL) Final     REPEATED TO VERIFY     DELTA CHECK NOTED     HCT  Date Value Range Status  01/24/2012 26.2* 36.0-46.0 (%) Final    Physical Exam:  General: alert Lochia: appropriate Uterine Fundus: firm Incision: healing well  Discharge Diagnoses: Term Pregnancy-delivered and Failed induction  Discharge Information: Date: 01/26/2012 Activity: Limited Diet: routine Medications: PNV, Ibuprofen and Percocet Condition: stable Instructions: refer to practice specific booklet Discharge to: home Follow-up Information    Schedule an appointment as soon as possible for a visit with Almon Hercules., MD.   [facility] information:   [address] [place] [address] [phone]          Newborn Data: Live born female  Birth Weight: 9 lb 1 oz (4111 g) APGAR: 8, 9  Home with mother.  Hertha Gergen D 01/26/2012, 10:48 AM

## 2012-01-26 NOTE — Discharge Instructions (Signed)

## 2013-05-05 ENCOUNTER — Other Ambulatory Visit: Payer: Self-pay | Admitting: Obstetrics and Gynecology

## 2013-06-30 ENCOUNTER — Inpatient Hospital Stay (HOSPITAL_COMMUNITY)
Admission: AD | Admit: 2013-06-30 | Discharge: 2013-06-30 | Disposition: A | Discharge status: Home / Self Care | Payer: BC Managed Care – PPO | Source: Ambulatory Visit | Attending: Obstetrics and Gynecology | Admitting: Obstetrics and Gynecology

## 2013-06-30 ENCOUNTER — Encounter (HOSPITAL_COMMUNITY): Payer: Self-pay | Admitting: *Deleted

## 2013-06-30 ENCOUNTER — Inpatient Hospital Stay (HOSPITAL_COMMUNITY): Payer: BC Managed Care – PPO

## 2013-06-30 DIAGNOSIS — R109 Unspecified abdominal pain: Secondary | ICD-10-CM | POA: Insufficient documentation

## 2013-06-30 DIAGNOSIS — O4692 Antepartum hemorrhage, unspecified, second trimester: Secondary | ICD-10-CM

## 2013-06-30 DIAGNOSIS — O209 Hemorrhage in early pregnancy, unspecified: Secondary | ICD-10-CM | POA: Insufficient documentation

## 2013-06-30 LAB — CBC
MCV: 87.4 fL (ref 78.0–100.0)
Platelets: 192 10*3/uL (ref 150–400)
RDW: 13.1 % (ref 11.5–15.5)
WBC: 10.5 10*3/uL (ref 4.0–10.5)

## 2013-06-30 LAB — ABO/RH: ABO/RH(D): O POS

## 2013-06-30 NOTE — MAU Provider Note (Signed)
Agree 

## 2013-06-30 NOTE — MAU Note (Signed)
13wks preg, started bleeding around 1700.  Spotting earlier in preg- everything was fine.

## 2013-06-30 NOTE — Discharge Instructions (Signed)
Vaginal Bleeding During Pregnancy, Second Trimester A small amount of bleeding (spotting) is relatively common in pregnancy. It usually stops on its own. There are many causes for bleeding or spotting in pregnancy. Some bleeding may be related to the pregnancy and some may not. Cramping with the bleeding is more serious and concerning. Tell your caregiver if you have any vaginal bleeding.  CAUSES   Infection, inflammation or growths on the cervix.  The placenta may partially or completely be covering the opening of the cervix inside the uterus.  The placenta may have separated from the uterus.  You may be having early/preterm labor.  The cervix is not strong enough to keep a baby inside the uterus (cervical insufficiency).  Many tiny cysts in the uterus instead of pregnancy tissue (molar pregnancy) SYMPTOMS   Vaginal spotting or bleeding with or without cramps.  Uterine contractions.  Abnormal vaginal discharge.  You may have spotting or spotting after having sexual intercourse. DIAGNOSIS  To evaluate the pregnancy, your caregiver may:  Do a pelvic exam.  Take blood tests.  Do an ultrasound. It is very important to follow your caregiver's instructions.  TREATMENT   Evaluation of the pregnancy with blood tests and ultrasound.  Bed rest (getting up to use the bathroom only).  Rho-gam immunization if the mother is Rh negative and the father is Rh positive.  If you are having uterine contractions, you may be given medication to stop the contractions.  If you have cervical insufficiency, you may have a suture placed in the cervix to close it. HOME CARE INSTRUCTIONS   If your caregiver orders bed rest, you may need to make arrangements for the care of other children and for any other responsibilities. However, your caregiver may allow you to continue light activity.  Keep track of the number of pads you use each day and how soaked (saturated) they are. Write this down.  Do  not use tampons. Do not douche.  Do not have sexual intercourse or orgasms until approved by your physician.  Save any tissue that you pass for your caregiver to see.  Take medicine for cramps only with your caregiver's permission.  Do not take aspirin because it can make you bleed.  Do not exercise, do any strenuous activities or heavy lifting without your caregiver's permission. SEEK IMMEDIATE MEDICAL CARE IF:   You experience severe cramps in your stomach, back or belly (abdomen).  You have uterine contractions.  You have an oral temperature above 102 F (38.9 C), not controlled by medicine.  You develop chills.  You pass large clots or tissue.  Your bleeding increases or you become light-headed, weak or have fainting episodes.  You have leaking or a gush of fluid from your vagina. Document Released: 07/24/2005 Document Revised: 01/06/2012 Document Reviewed: 02/02/2009 [facility] Patient Information 2014 [place], [place].  Pelvic Rest Pelvic rest is sometimes recommended for women when:   The placenta is partially or completely covering the opening of the cervix (placenta previa).  There is bleeding between the uterine wall and the amniotic sac in the first trimester (subchorionic hemorrhage).  The cervix begins to open without labor starting (incompetent cervix, cervical insufficiency).  The labor is too early (preterm labor). HOME CARE INSTRUCTIONS  Do not have sexual intercourse, stimulation, or an orgasm.  Do not use tampons, douche, or put anything in the vagina.  Do not lift anything over 10 pounds (4.5 kg).  Avoid strenuous activity or straining your pelvic muscles. SEEK MEDICAL CARE IF:  You have any vaginal bleeding during pregnancy. Treat this as a potential emergency.  You have cramping pain felt low in the stomach (stronger than menstrual cramps).  You notice vaginal discharge (watery, mucus, or bloody).  You have a low, dull backache.  There  are regular contractions or uterine tightening. SEEK IMMEDIATE MEDICAL CARE IF: You have vaginal bleeding and have placenta previa.  Document Released: 02/08/2011 Document Revised: 01/06/2012 Document Reviewed: 02/08/2011 [facility] Patient Information 2014 [place], [place].

## 2013-06-30 NOTE — MAU Provider Note (Signed)
History     CSN: [id number]  Arrival date and time: 06/30/13 4098   None     Chief Complaint  Patient presents with  . Vaginal Bleeding   HPI This is a 27 y.o. female at [redacted]w[redacted]d who presents with c/o heavy bleeding at home and here. Had onset of severe cramping before the first gush. Less bleeding and less pain now. Has had US's at office which were normal.   RN Note: 13wks preg, started bleeding around 1700. Spotting earlier in preg- everything was fine.       OB History   Grav Para Term Preterm Abortions TAB SAB Ect Mult Living   2 1 1  0 0 0 0 0 0 1      Past Medical History  Diagnosis Date  . Pneumonia 1992    pt was in first grade; treated  . GERD (gastroesophageal reflux disease)     just with pregnancy; Tums help     Past Surgical History  Procedure Laterality Date  . Wisdom tooth extraction  1999  . Cesarean section  01/23/2012    Procedure: CESAREAN SECTION;  Surgeon: Freddrick March. Tenny Craw, MD;  Location: [facility];  Service: Gynecology;  Laterality: N/A;    History reviewed. No pertinent family history.  History  Substance Use Topics  . Smoking status: Never Smoker   . Smokeless tobacco: Never Used  . Alcohol Use: No    Allergies:  Allergies  Allergen Reactions  . Meningococcal Vaccines Other (See Comments)    Husband did not know reaction - he just knew she was allergic to it    Prescriptions prior to admission  Medication Sig Dispense Refill  . calcium carbonate (TUMS - DOSED IN MG ELEMENTAL CALCIUM) 500 MG chewable tablet Chew 1 tablet by mouth daily as needed. For heartburn      . Prenatal Vit-Fe Fumarate-FA (PRENATAL MULTIVITAMIN) TABS Take 1 tablet by mouth daily.        Review of Systems  Constitutional: Negative for fever, chills and malaise/fatigue.  Gastrointestinal: Positive for abdominal pain. Negative for nausea, vomiting, diarrhea and constipation.  Neurological: Negative for dizziness.   Physical Exam   Blood pressure 125/74, pulse  90, temperature 98.8 F (37.1 C), temperature source Oral, resp. rate 20, height 5' (1.524 m), weight 65.59 kg (144 lb 9.6 oz).  Physical Exam  Constitutional: She is oriented to person, place, and time. She appears well-developed and well-nourished. No distress (but crying, nervous).  HENT:  Head: Normocephalic.  Cardiovascular: Normal rate.   Respiratory: Effort normal.  GI: Soft. She exhibits no distension. There is tenderness (over uterus, esp. with cervical pressure). There is no rebound.  Genitourinary: Uterus normal. Vaginal discharge (moderate blood in vault, no active flow) found.  Musculoskeletal: Normal range of motion.  Neurological: She is alert and oriented to person, place, and time.  Skin: Skin is warm and dry.  Psychiatric: She has a normal mood and affect.   Unable to hear FHTs but movement heard  MAU Course  Procedures  MDM Will check [place] and CBC, ABO/Rh  Assessment and Plan  A:  SIUP at [redacted]w[redacted]d      Second trimester bleeding and cramping  P:  Check labs and [place] as above       Dr Claiborne Billings aware of patient being here  [facility] Macla[facility] 06/30/2013, 7:36 PM   Dorathy Kinsman, CNM assumed care of patient at 8 PM. Patient awaiting ultrasound.  Results for orders placed during the hospital encounter of 06/30/13 (  from the past 24 hour(s))  ABO/RH     Status: None   Collection Time    06/30/13  8:01 PM      Result Value Range   ABO/RH(D) O POS    CBC     Status: None   Collection Time    06/30/13  8:01 PM      Result Value Range   WBC 10.5  4.0 - 10.5 K/uL   RBC 4.29  3.87 - 5.11 MIL/uL   Hemoglobin 13.4  12.0 - 15.0 g/dL   HCT 16.1  09.6 - 04.5 %   MCV 87.4  78.0 - 100.0 fL   MCH 31.2  26.0 - 34.0 pg   MCHC 35.7  30.0 - 36.0 g/dL   RDW 40.9  81.1 - 91.4 %   Platelets 192  150 - 400 K/uL   US Ob Comp Less 14   06/30/2013   *RADIOLOGY REPORT*  Clinical Data:  Abnormal uterine bleeding.  Early pregnancy.  OBSTETRIC <14 WK [place] AND TRANSVAGINAL OB US  Technique:   Both transabdominal and transvaginal ultrasound examinations were performed for complete evaluation of the gestation as well as the maternal uterus, adnexal regions, and pelvic cul-de-sac.  Transvaginal technique was performed to assess early pregnancy.  Comparison:  None.  Intrauterine gestational sac:  Single Yolk sac: No Embryo: Yes Cardiac Activity: Yes Heart Rate: 158 bpm CRL: 7.6  cm  13 w  5 d             [place] EDC: 12/31/2013  Maternal uterus/adnexae: No subchorionic hemorrhage.  The ovaries appear normal.  No free fluid.  IMPRESSION: Normal appearing intrauterine pregnancy of 13 weeks 5 days gestation.   Original Report Authenticated By: Francene Boyers, M.D.   Scan bleeding the remainder MAU visit.  ASSESSMENT 1. Second trimester bleeding   2.   Live IUP.  PLAN Discharge home in stable condition. Bleeding precautions. Pelvic rest x1 week. Explained that it is too early to determine placentation. Follow-up Information   Follow up with [facility]. (As scheduled or as needed if symptoms worsen)    Contact information:   [address] [place] [address] [phone]      Follow up with [facility]. (As needed if symptoms worsen)    Contact information:   [address] [id number] [place] [address] [phone]       Medication List         calcium carbonate 500 MG chewable tablet  Commonly known as:  TUMS - dosed in mg elemental calcium  Chew 1 tablet by mouth daily as needed. For heartburn     prenatal multivitamin Tabs tablet  Take 1 tablet by mouth daily.       [place], [place] 06/30/2013 9:55 PM

## 2013-12-14 ENCOUNTER — Encounter (HOSPITAL_COMMUNITY): Payer: Self-pay | Admitting: Pharmacist

## 2013-12-22 ENCOUNTER — Inpatient Hospital Stay (HOSPITAL_COMMUNITY): Payer: BC Managed Care – PPO | Admitting: Anesthesiology

## 2013-12-22 ENCOUNTER — Inpatient Hospital Stay (HOSPITAL_COMMUNITY)
Admission: AD | Admit: 2013-12-22 | Discharge: 2013-12-24 | DRG: 766 | Disposition: A | Discharge status: Home / Self Care | Payer: BC Managed Care – PPO | Source: Ambulatory Visit | Attending: Obstetrics and Gynecology | Admitting: Obstetrics and Gynecology

## 2013-12-22 ENCOUNTER — Encounter (HOSPITAL_COMMUNITY)
Admission: AD | Disposition: A | Discharge status: Home / Self Care | Payer: Self-pay | Source: Ambulatory Visit | Attending: Obstetrics and Gynecology

## 2013-12-22 ENCOUNTER — Inpatient Hospital Stay (HOSPITAL_COMMUNITY)
Admission: RE | Admit: 2013-12-22 | Payer: BC Managed Care – PPO | Source: Ambulatory Visit | Admitting: Obstetrics and Gynecology

## 2013-12-22 ENCOUNTER — Encounter (HOSPITAL_COMMUNITY): Payer: Self-pay

## 2013-12-22 ENCOUNTER — Encounter (HOSPITAL_COMMUNITY): Payer: BC Managed Care – PPO | Admitting: Anesthesiology

## 2013-12-22 DIAGNOSIS — K219 Gastro-esophageal reflux disease without esophagitis: Secondary | ICD-10-CM | POA: Diagnosis present

## 2013-12-22 DIAGNOSIS — O34219 Maternal care for unspecified type scar from previous cesarean delivery: Principal | ICD-10-CM | POA: Diagnosis present

## 2013-12-22 DIAGNOSIS — O4290 Premature rupture of membranes, unspecified as to length of time between rupture and onset of labor, unspecified weeks of gestation: Secondary | ICD-10-CM | POA: Diagnosis present

## 2013-12-22 LAB — CBC
HEMATOCRIT: 41.6 % (ref 36.0–46.0)
Hemoglobin: 14.2 g/dL (ref 12.0–15.0)
MCH: 31.5 pg (ref 26.0–34.0)
MCHC: 34.1 g/dL (ref 30.0–36.0)
MCV: 92.2 fL (ref 78.0–100.0)
PLATELETS: 211 10*3/uL (ref 150–400)
RBC: 4.51 MIL/uL (ref 3.87–5.11)
RDW: 13.6 % (ref 11.5–15.5)
WBC: 9.8 10*3/uL (ref 4.0–10.5)

## 2013-12-22 LAB — TYPE AND SCREEN
ABO/RH(D): O POS
ANTIBODY SCREEN: NEGATIVE

## 2013-12-22 LAB — RPR: RPR Ser Ql: NONREACTIVE

## 2013-12-22 SURGERY — Surgical Case
Anesthesia: Spinal | Site: Abdomen

## 2013-12-22 MED ORDER — BUPIVACAINE IN DEXTROSE 0.75-8.25 % IT SOLN
INTRATHECAL | Status: DC | PRN
  Administered 2013-12-22: 1.5 mL via INTRATHECAL

## 2013-12-22 MED ORDER — LACTATED RINGERS IV SOLN
INTRAVENOUS | Status: DC | PRN
  Administered 2013-12-22: 16:00:00 via INTRAVENOUS

## 2013-12-22 MED ORDER — WITCH HAZEL-GLYCERIN EX PADS: 1.0000 "application " | MEDICATED_PAD | CUTANEOUS | Status: DC | PRN

## 2013-12-22 MED ORDER — PRENATAL MULTIVITAMIN CH
1.0000 | ORAL_TABLET | Freq: Every day | ORAL | Status: DC
  Administered 2013-12-23: 1 via ORAL
  Filled 2013-12-22 (×2): qty 1

## 2013-12-22 MED ORDER — SIMETHICONE 80 MG PO CHEW
80.0000 mg | CHEWABLE_TABLET | ORAL | Status: DC | PRN
  Filled 2013-12-22: qty 1

## 2013-12-22 MED ORDER — FENTANYL CITRATE 0.05 MG/ML IJ SOLN
25.0000 ug | INTRAMUSCULAR | Status: DC | PRN
  Administered 2013-12-22 (×2): 25 ug via INTRAVENOUS

## 2013-12-22 MED ORDER — DIPHENHYDRAMINE HCL 25 MG PO CAPS
25.0000 mg | ORAL_CAPSULE | Freq: Four times a day (QID) | ORAL | Status: DC | PRN
  Filled 2013-12-22: qty 1

## 2013-12-22 MED ORDER — MORPHINE SULFATE (PF) 0.5 MG/ML IJ SOLN
INTRAMUSCULAR | Status: DC | PRN
  Administered 2013-12-22: .2 mg via INTRATHECAL

## 2013-12-22 MED ORDER — FENTANYL CITRATE 0.05 MG/ML IJ SOLN
INTRAMUSCULAR | Status: DC | PRN
  Administered 2013-12-22: 20 ug via INTRATHECAL

## 2013-12-22 MED ORDER — SCOPOLAMINE 1 MG/3DAYS TD PT72
1.0000 | MEDICATED_PATCH | Freq: Once | TRANSDERMAL | Status: DC
  Administered 2013-12-22: 1.5 mg via TRANSDERMAL

## 2013-12-22 MED ORDER — METHYLERGONOVINE MALEATE 0.2 MG/ML IJ SOLN: 0.2000 mg | INTRAMUSCULAR | Status: DC | PRN

## 2013-12-22 MED ORDER — KETOROLAC TROMETHAMINE 30 MG/ML IJ SOLN
30.0000 mg | Freq: Four times a day (QID) | INTRAMUSCULAR | Status: AC | PRN
  Administered 2013-12-22: 30 mg via INTRAVENOUS

## 2013-12-22 MED ORDER — PHENYLEPHRINE 40 MCG/ML (10ML) SYRINGE FOR IV PUSH (FOR BLOOD PRESSURE SUPPORT)
PREFILLED_SYRINGE | INTRAVENOUS | Status: AC
  Filled 2013-12-22: qty 5

## 2013-12-22 MED ORDER — METOCLOPRAMIDE HCL 5 MG/ML IJ SOLN: 10.0000 mg | Freq: Three times a day (TID) | INTRAMUSCULAR | Status: DC | PRN

## 2013-12-22 MED ORDER — FENTANYL CITRATE 0.05 MG/ML IJ SOLN
INTRAMUSCULAR | Status: AC
Start: 2013-12-22 — End: 2013-12-23
  Filled 2013-12-22: qty 2

## 2013-12-22 MED ORDER — LACTATED RINGERS IV SOLN
INTRAVENOUS | Status: DC
  Administered 2013-12-23: 03:00:00 via INTRAVENOUS

## 2013-12-22 MED ORDER — ONDANSETRON HCL 4 MG/2ML IJ SOLN: 4.0000 mg | Freq: Three times a day (TID) | INTRAMUSCULAR | Status: DC | PRN

## 2013-12-22 MED ORDER — ONDANSETRON HCL 4 MG/2ML IJ SOLN: 4.0000 mg | Freq: Once | INTRAMUSCULAR | Status: DC | PRN

## 2013-12-22 MED ORDER — NALOXONE HCL 1 MG/ML IJ SOLN
1.0000 ug/kg/h | INTRAVENOUS | Status: DC | PRN
  Filled 2013-12-22: qty 2

## 2013-12-22 MED ORDER — ONDANSETRON HCL 4 MG/2ML IJ SOLN
INTRAMUSCULAR | Status: AC
  Filled 2013-12-22: qty 2

## 2013-12-22 MED ORDER — NALBUPHINE HCL 10 MG/ML IJ SOLN
5.0000 mg | INTRAMUSCULAR | Status: DC | PRN
Start: 2013-12-22 — End: 2013-12-24

## 2013-12-22 MED ORDER — DEXTROSE 5 % IV SOLN
2.0000 g | INTRAVENOUS | Status: AC
  Administered 2013-12-22: 2 g via INTRAVENOUS
  Filled 2013-12-22: qty 2

## 2013-12-22 MED ORDER — SCOPOLAMINE 1 MG/3DAYS TD PT72
MEDICATED_PATCH | TRANSDERMAL | Status: AC
  Filled 2013-12-22: qty 1

## 2013-12-22 MED ORDER — KETOROLAC TROMETHAMINE 30 MG/ML IJ SOLN: 30.0000 mg | Freq: Four times a day (QID) | INTRAMUSCULAR | Status: AC | PRN

## 2013-12-22 MED ORDER — LACTATED RINGERS IV SOLN
INTRAVENOUS | Status: DC
  Administered 2013-12-22 (×2): via INTRAVENOUS

## 2013-12-22 MED ORDER — DIPHENHYDRAMINE HCL 25 MG PO CAPS
25.0000 mg | ORAL_CAPSULE | ORAL | Status: DC | PRN
  Filled 2013-12-22: qty 1

## 2013-12-22 MED ORDER — FAMOTIDINE IN NACL 20-0.9 MG/50ML-% IV SOLN
20.0000 mg | Freq: Once | INTRAVENOUS | Status: AC
  Administered 2013-12-22: 20 mg via INTRAVENOUS
  Filled 2013-12-22: qty 50

## 2013-12-22 MED ORDER — OXYCODONE-ACETAMINOPHEN 5-325 MG PO TABS
1.0000 | ORAL_TABLET | ORAL | Status: DC | PRN
  Administered 2013-12-23 – 2013-12-24 (×2): 1 via ORAL
  Filled 2013-12-22 (×2): qty 1

## 2013-12-22 MED ORDER — PHENYLEPHRINE 8 MG IN D5W 100 ML (0.08MG/ML) PREMIX OPTIME
INJECTION | INTRAVENOUS | Status: DC | PRN
  Administered 2013-12-22: 60 ug/min via INTRAVENOUS

## 2013-12-22 MED ORDER — PHENYLEPHRINE 8 MG IN D5W 100 ML (0.08MG/ML) PREMIX OPTIME
INJECTION | INTRAVENOUS | Status: AC
  Filled 2013-12-22: qty 100

## 2013-12-22 MED ORDER — SENNOSIDES-DOCUSATE SODIUM 8.6-50 MG PO TABS
2.0000 | ORAL_TABLET | ORAL | Status: DC
  Administered 2013-12-23 (×2): 2 via ORAL
  Filled 2013-12-22 (×4): qty 2

## 2013-12-22 MED ORDER — ZOLPIDEM TARTRATE 5 MG PO TABS: 5.0000 mg | ORAL_TABLET | Freq: Every evening | ORAL | Status: DC | PRN

## 2013-12-22 MED ORDER — SIMETHICONE 80 MG PO CHEW
80.0000 mg | CHEWABLE_TABLET | Freq: Three times a day (TID) | ORAL | Status: DC
  Administered 2013-12-23 (×3): 80 mg via ORAL
  Filled 2013-12-22 (×7): qty 1

## 2013-12-22 MED ORDER — NALOXONE HCL 0.4 MG/ML IJ SOLN: 0.4000 mg | INTRAMUSCULAR | Status: DC | PRN

## 2013-12-22 MED ORDER — ONDANSETRON HCL 4 MG/2ML IJ SOLN: 4.0000 mg | INTRAMUSCULAR | Status: DC | PRN

## 2013-12-22 MED ORDER — EPHEDRINE SULFATE 50 MG/ML IJ SOLN
INTRAMUSCULAR | Status: DC | PRN
  Administered 2013-12-22 (×2): 10 mg via INTRAVENOUS

## 2013-12-22 MED ORDER — OXYTOCIN 40 UNITS IN LACTATED RINGERS INFUSION - SIMPLE MED
INTRAVENOUS | Status: DC | PRN
  Administered 2013-12-22: 40 [IU] via INTRAVENOUS

## 2013-12-22 MED ORDER — PHENYLEPHRINE HCL 10 MG/ML IJ SOLN
INTRAMUSCULAR | Status: DC | PRN
  Administered 2013-12-22 (×4): 40 ug via INTRAVENOUS

## 2013-12-22 MED ORDER — SODIUM CHLORIDE 0.9 % IJ SOLN: 3.0000 mL | INTRAMUSCULAR | Status: DC | PRN

## 2013-12-22 MED ORDER — FENTANYL CITRATE 0.05 MG/ML IJ SOLN
INTRAMUSCULAR | Status: AC
  Filled 2013-12-22: qty 2

## 2013-12-22 MED ORDER — METHYLERGONOVINE MALEATE 0.2 MG PO TABS: 0.2000 mg | ORAL_TABLET | ORAL | Status: DC | PRN

## 2013-12-22 MED ORDER — NALBUPHINE HCL 10 MG/ML IJ SOLN: 5.0000 mg | INTRAMUSCULAR | Status: DC | PRN

## 2013-12-22 MED ORDER — LANOLIN HYDROUS EX OINT: 1.0000 "application " | TOPICAL_OINTMENT | CUTANEOUS | Status: DC | PRN

## 2013-12-22 MED ORDER — IBUPROFEN 600 MG PO TABS
600.0000 mg | ORAL_TABLET | Freq: Four times a day (QID) | ORAL | Status: DC
  Administered 2013-12-23 – 2013-12-24 (×6): 600 mg via ORAL
  Filled 2013-12-22 (×6): qty 1

## 2013-12-22 MED ORDER — ONDANSETRON HCL 4 MG/2ML IJ SOLN
INTRAMUSCULAR | Status: DC | PRN
  Administered 2013-12-22: 4 mg via INTRAVENOUS

## 2013-12-22 MED ORDER — DIPHENHYDRAMINE HCL 50 MG/ML IJ SOLN
25.0000 mg | INTRAMUSCULAR | Status: DC | PRN
Start: 2013-12-22 — End: 2013-12-24

## 2013-12-22 MED ORDER — EPHEDRINE 5 MG/ML INJ
INTRAVENOUS | Status: AC
  Filled 2013-12-22: qty 10

## 2013-12-22 MED ORDER — KETOROLAC TROMETHAMINE 30 MG/ML IJ SOLN
INTRAMUSCULAR | Status: AC
  Filled 2013-12-22: qty 1

## 2013-12-22 MED ORDER — TETANUS-DIPHTH-ACELL PERTUSSIS 5-2.5-18.5 LF-MCG/0.5 IM SUSP
0.5000 mL | Freq: Once | INTRAMUSCULAR | Status: DC
  Filled 2013-12-22: qty 0.5

## 2013-12-22 MED ORDER — DIPHENHYDRAMINE HCL 50 MG/ML IJ SOLN: 12.5000 mg | INTRAMUSCULAR | Status: DC | PRN

## 2013-12-22 MED ORDER — MENTHOL 3 MG MT LOZG
1.0000 | LOZENGE | OROMUCOSAL | Status: DC | PRN
  Filled 2013-12-22: qty 9

## 2013-12-22 MED ORDER — ONDANSETRON HCL 4 MG PO TABS: 4.0000 mg | ORAL_TABLET | ORAL | Status: DC | PRN

## 2013-12-22 MED ORDER — OXYTOCIN 10 UNIT/ML IJ SOLN
INTRAMUSCULAR | Status: AC
  Filled 2013-12-22: qty 4

## 2013-12-22 MED ORDER — OXYTOCIN 40 UNITS IN LACTATED RINGERS INFUSION - SIMPLE MED: 62.5000 mL/h | INTRAVENOUS | Status: AC

## 2013-12-22 MED ORDER — MEPERIDINE HCL 25 MG/ML IJ SOLN: 6.2500 mg | INTRAMUSCULAR | Status: DC | PRN

## 2013-12-22 MED ORDER — CITRIC ACID-SODIUM CITRATE 334-500 MG/5ML PO SOLN
30.0000 mL | Freq: Once | ORAL | Status: AC
  Administered 2013-12-22: 30 mL via ORAL
  Filled 2013-12-22: qty 15

## 2013-12-22 MED ORDER — DIBUCAINE 1 % RE OINT
1.0000 "application " | TOPICAL_OINTMENT | RECTAL | Status: DC | PRN
  Filled 2013-12-22: qty 28

## 2013-12-22 MED ORDER — SIMETHICONE 80 MG PO CHEW
80.0000 mg | CHEWABLE_TABLET | ORAL | Status: DC
  Administered 2013-12-23 (×2): 80 mg via ORAL
  Filled 2013-12-22 (×4): qty 1

## 2013-12-22 MED ORDER — MORPHINE SULFATE 0.5 MG/ML IJ SOLN
INTRAMUSCULAR | Status: AC
  Filled 2013-12-22: qty 10

## 2013-12-22 SURGICAL SUPPLY — 28 items
CLAMP CORD UMBIL (MISCELLANEOUS) IMPLANT
CLOTH BEACON ORANGE TIMEOUT ST (SAFETY) ×3 IMPLANT
DERMABOND ADVANCED (GAUZE/BANDAGES/DRESSINGS)
DERMABOND ADVANCED .7 DNX12 (GAUZE/BANDAGES/DRESSINGS) IMPLANT
DRAPE LG THREE QUARTER DISP (DRAPES) IMPLANT
DRSG OPSITE POSTOP 4X10 (GAUZE/BANDAGES/DRESSINGS) ×3 IMPLANT
DURAPREP 26ML APPLICATOR (WOUND CARE) ×3 IMPLANT
ELECT REM PT RETURN 9FT ADLT (ELECTROSURGICAL) ×3
ELECTRODE REM PT RTRN 9FT ADLT (ELECTROSURGICAL) ×1 IMPLANT
EXTRACTOR VACUUM M CUP 4 TUBE (SUCTIONS) IMPLANT
EXTRACTOR VACUUM M CUP 4' TUBE (SUCTIONS)
GLOVE BIO SURGEON STRL SZ7 (GLOVE) ×3 IMPLANT
GOWN STRL REIN XL XLG (GOWN DISPOSABLE) ×6 IMPLANT
KIT ABG SYR 3ML LUER SLIP (SYRINGE) IMPLANT
NEEDLE HYPO 25X5/8 SAFETYGLIDE (NEEDLE) IMPLANT
NS IRRIG 1000ML POUR BTL (IV SOLUTION) ×3 IMPLANT
PACK C SECTION WH (CUSTOM PROCEDURE TRAY) ×3 IMPLANT
PAD OB MATERNITY 4.3X12.25 (PERSONAL CARE ITEMS) ×3 IMPLANT
RTRCTR C-SECT PINK 25CM LRG (MISCELLANEOUS) ×3 IMPLANT
STAPLER VISISTAT 35W (STAPLE) IMPLANT
SUT CHROMIC 1 CTX 36 (SUTURE) ×6 IMPLANT
SUT PDS AB 0 CTX 60 (SUTURE) ×3 IMPLANT
SUT VIC AB 2-0 CT1 27 (SUTURE) ×2
SUT VIC AB 2-0 CT1 TAPERPNT 27 (SUTURE) ×1 IMPLANT
SUT VIC AB 4-0 KS 27 (SUTURE) ×3 IMPLANT
TOWEL OR 17X24 6PK STRL BLUE (TOWEL DISPOSABLE) ×3 IMPLANT
TRAY FOLEY CATH 14FR (SET/KITS/TRAYS/PACK) ×3 IMPLANT
WATER STERILE IRR 1000ML POUR (IV SOLUTION) ×3 IMPLANT

## 2013-12-22 NOTE — MAU Note (Signed)
Patient state she had rupture of membranes at 0500 with clear fluid, rare contractions. Patient is scheduled for a repeat cesarean section on 3-3. Patient reports good fetal movement.

## 2013-12-22 NOTE — H&P (Signed)
Breanna CrochetKristen Turner is a 28 y.o. female presenting for leaking fluid  28 yo G2P1001 @ 38+3 presents for c/o leaking fluid and was confirmed to have spontaneously ruptured membranes. Her pregnancy has been uncomplicated to this point.  She has a Turner/o of a prior cesarean section and she desires a repeat. History OB History   Grav Para Term Preterm Abortions TAB SAB Ect Mult Living   2 1 1  0 0 0 0 0 0 1     Past Medical History  Diagnosis Date  . Pneumonia 1992    pt was in first grade; treated  . GERD (gastroesophageal reflux disease)     just with pregnancy; Tums help    Past Surgical History  Procedure Laterality Date  . Wisdom tooth extraction  1999  . Cesarean section  01/23/2012    Procedure: CESAREAN SECTION;  Surgeon: Freddrick MarchKendra Turner. Tenny Crawoss, MD;  Location: [facility];  Service: Gynecology;  Laterality: N/A;   Family History: family history is not on file. Social History:  reports that she has never smoked. She has never used smokeless tobacco. She reports that she does not drink alcohol or use illicit drugs.   Prenatal Transfer Tool  Maternal Diabetes: No Genetic Screening: Normal Maternal Ultrasounds/Referrals: Normal Fetal Ultrasounds or other Referrals:  None Maternal Substance Abuse:  No Significant Maternal Medications:  None Significant Maternal Lab Results:  None Other Comments:  None  ROS: as above    Blood pressure 107/63, pulse 83, temperature 98.1 F (36.7 C), temperature source Oral, resp. rate 16, height 5' (1.524 m), weight 81.103 kg (178 lb 12.8 oz), SpO2 99.00%. Exam Physical Exam  Prenatal labs: ABO, Rh: --/--/O POS (09/03 2001) Antibody:  Negative Rubella:  Immune RPR:  NR  HBsAg:   Neg HIV:   NR GBS:   Neg  Assessment/Plan: 1) Admit 2) Consent for repeat cesarean section. R/B/A reviewed with the patient and her husband and she wishes to proceed 3) T&S   [name]. 12/22/2013, 9:41 AM

## 2013-12-22 NOTE — Anesthesia Preprocedure Evaluation (Signed)
Anesthesia Evaluation  Patient identified by MRN, date of birth, ID band Patient awake    Reviewed: Allergy & Precautions, H&P , NPO status , Patient's Chart, lab work & pertinent test results  History of Anesthesia Complications Negative for: history of anesthetic complications  Airway Mallampati: II TM Distance: >3 FB Neck ROM: Full    Dental  (+) Teeth Intact   Pulmonary neg pulmonary ROS,    Pulmonary exam normal       Cardiovascular negative cardio ROS  Rhythm:Regular Rate:Normal     Neuro/Psych negative neurological ROS  negative psych ROS   GI/Hepatic Neg liver ROS, GERD-  ,  Endo/Other  negative endocrine ROS  Renal/GU negative Renal ROS     Musculoskeletal negative musculoskeletal ROS (+)   Abdominal   Peds  Hematology negative hematology ROS (+)   Anesthesia Other Findings   Reproductive/Obstetrics (+) Pregnancy                           Anesthesia Physical Anesthesia Plan  ASA: II  Anesthesia Plan: Spinal   Post-op Pain Management:    Induction:   Airway Management Planned: Natural Airway  Additional Equipment: None  Intra-op Plan:   Post-operative Plan:   Informed Consent: I have reviewed the patients History and Physical, chart, labs and discussed the procedure including the risks, benefits and alternatives for the proposed anesthesia with the patient or authorized representative who has indicated his/her understanding and acceptance.   Dental advisory given  Plan Discussed with: CRNA and Surgeon  Anesthesia Plan Comments:         Anesthesia Quick Evaluation

## 2013-12-22 NOTE — Transfer of Care (Signed)
Immediate Anesthesia Transfer of Care Note  Patient: [name]  Procedure(s) Performed: Procedure(s): CESAREAN SECTION (N/A)  Patient Location: PACU  Anesthesia Type:Spinal  Level of Consciousness: awake, alert  and oriented  Airway & Oxygen Therapy: Patient Spontanous Breathing  Post-op Assessment: Report given to PACU RN and Post -op Vital signs reviewed and stable  Post vital signs: Reviewed and stable  Complications: No apparent anesthesia complications

## 2013-12-22 NOTE — Anesthesia Procedure Notes (Signed)
Spinal  Patient location during procedure: OR Start time: 12/22/2013 3:57 PM End time: 12/22/2013 4:02 PM Staffing Anesthesiologist: MOSER, CHRIS Performed by: anesthesiologist  Preanesthetic Checklist Completed: patient identified, surgical consent, pre-op evaluation, timeout performed, IV checked, risks and benefits discussed and monitors and equipment checked Spinal Block Patient position: sitting Prep: site prepped and draped and DuraPrep Patient monitoring: heart rate, cardiac monitor, continuous pulse ox and blood pressure Approach: midline Location: L3-4 Injection technique: single-shot Needle Needle type: Sprotte  Needle gauge: 24 G Needle length: 9 cm Assessment Sensory level: T4

## 2013-12-22 NOTE — Op Note (Addendum)
Pre-Operative Diagnosis: 1) 38+3 week intrauterine pregnancy 2) spontaneous rupture of membranes 3) history of prior cesarean section, desires repeat cesarean section Postoperative Diagnosis: Same Procedure: Repeat low transverse cesarean section Surgeon: Dr. Waynard ReedsKendra Raymar Joiner Assistant: none Operative Findings: Vigorous female infant in vertex presentation with apgars of 9 & 9. Ovaries and tubes were poorly visualized.  Specimen: placenta for disposal EBL: Total I/O In: 2000 [I.V.:2000] Out: 600 [Urine:100; Blood:500]   Procedure:[name] is an 28 year old gravida 2 para 1001 at 6738 weeks and 4 days estimated gestational age who presents for cesarean section. Following the appropriate informed consent the patient was brought to the operating room where spinal anesthesia was administered and found to be adequate. She was placed in the dorsal supine position with a leftward tilt. She was prepped and draped in the normal sterile fashion. Scalpel was then used to make a Pfannenstiel skin incision which was carried down to the underlying layers of soft tissue to the fascia. The fascia was incised in the midline and the fascial incision was extended laterally with Mayo scissors. The superior aspect of the fascial incision was grasped with Coker clamps x2, tented up and the rectus muscles dissected off sharply with the electrocautery unit area and the same procedure was repeated on the inferior aspect of the fascial incision. The rectus muscles were separated in the midline. The abdominal peritoneum was identified, tented up, entered sharply, and the incision was extended superiorly and inferiorly with good visualization of the bladder. The Alexis retractor was then deployed. The vesicouterine peritoneum was identified, tented up, entered sharply, and the bladder flap was created digitally. Scalpel was then used to make a low transverse incision on the uterus which was extended laterally with both blunt dissection  and the bandage scissors. The fetal vertex was identified, delivered easily through the uterine incision followed by the body. The infant was bulb suctioned on the operative field cried vigorously, cord was clamped and cut and the infant was passed to the waiting neonatologist. Placenta was then delivered spontaneously, the uterus was cleared of all clot and debris. The uterine incision was repaired with #1 chromic in running locked fashion followed by a second imbricating layer. Ovaries and tubes were inspected and normal. The Alexis retractor was removed. The uterus was returned to the abdominal cavity the abdominal cavity was cleared of all clot and debris. The abdominal peritoneum was reapproximated with 2-0 Vicryl in a running fashion, the rectus muscles was reapproximated with #1 chromic in a running fashion. The fascia was closed with a looped PDS in a running fashion. The skin was closed with 4-0 vicryl in a subcuticular fashion and Dermabond. All sponge lap and needle counts were correct x2. Patient tolerated the procedure well and recovered in stable condition following the procedure.

## 2013-12-23 ENCOUNTER — Encounter (HOSPITAL_COMMUNITY): Payer: Self-pay | Admitting: Obstetrics and Gynecology

## 2013-12-23 LAB — CBC
HCT: 35.5 % — ABNORMAL LOW (ref 36.0–46.0)
HEMOGLOBIN: 12.2 g/dL (ref 12.0–15.0)
MCH: 31.4 pg (ref 26.0–34.0)
MCHC: 34.4 g/dL (ref 30.0–36.0)
MCV: 91.5 fL (ref 78.0–100.0)
Platelets: 185 10*3/uL (ref 150–400)
RBC: 3.88 MIL/uL (ref 3.87–5.11)
RDW: 13.6 % (ref 11.5–15.5)
WBC: 10.5 10*3/uL (ref 4.0–10.5)

## 2013-12-23 NOTE — Anesthesia Postprocedure Evaluation (Signed)
Anesthesia Post Note  Patient: [name]  Procedure(s) Performed: Procedure(s) (LRB): CESAREAN SECTION (N/A)  Anesthesia type: Epidural  Patient location: Mother/Baby  Post pain: Pain level controlled  Post assessment: Post-op Vital signs reviewed  Last Vitals:  Filed Vitals:   12/23/13 0602  BP: 90/57  Pulse: 67  Temp:   Resp: 18    Post vital signs: Reviewed  Level of consciousness:alert  Complications: No apparent anesthesia complications

## 2013-12-23 NOTE — Anesthesia Postprocedure Evaluation (Signed)
  Anesthesia Post-op Note  Patient: [name]  Procedure(s) Performed: Procedure(s): CESAREAN SECTION (N/A)  Patient Location: PACU  Anesthesia Type:Spinal  Level of Consciousness: awake, alert  and oriented  Airway and Oxygen Therapy: Patient Spontanous Breathing  Post-op Pain: mild  Post-op Assessment: Post-op Vital signs reviewed, Patient's Cardiovascular Status Stable, Respiratory Function Stable, Patent Airway, No signs of Nausea or vomiting and Pain level controlled  Post-op Vital Signs: Reviewed and stable  Complications: No apparent anesthesia complications

## 2013-12-23 NOTE — Lactation Note (Signed)
This note was copied from the chart of Girl Royetta CrochetKristen Beal. Lactation Consultation Note  Patient Name: Girl Royetta CrochetKristen Longnecker QMVHQ'IToday's Date: 12/23/2013 Reason for consult: Initial assessment Mom is experienced BF. Mom concerned baby getting a shallow latch. No nipple trauma present. Assisted Mom with positioning and obtaining good depth by using breast compression. BF basics reviewed with Mom. Lactation brochure left for review. Advised of OP services and support group. Encouraged to call for assist as needed.   Maternal Data Formula Feeding for Exclusion: No Infant to breast within first hour of birth: Yes Has patient been taught Hand Expression?: Yes Does the patient have breastfeeding experience prior to this delivery?: Yes  Feeding Feeding Type: Breast Fed  LATCH Score/Interventions Latch: Grasps breast easily, tongue down, lips flanged, rhythmical sucking.  Audible Swallowing: A few with stimulation  Type of Nipple: Everted at rest and after stimulation  Comfort (Breast/Nipple): Soft / non-tender     Hold (Positioning): Assistance needed to correctly position infant at breast and maintain latch. Intervention(s): Breastfeeding basics reviewed;Support Pillows;Position options;Skin to skin  LATCH Score: 8  Lactation Tools Discussed/Used     Consult Status Consult Status: Follow-up Date: 12/24/13 Follow-up type: In-patient    Alfred LevinsGranger, Haya Hemler Ann 12/23/2013, 10:02 AM

## 2013-12-23 NOTE — Progress Notes (Signed)
  Patient is eating, ambulating, voiding.  Pain control is good.  Filed Vitals:   12/23/13 0200 12/23/13 0559 12/23/13 0600 12/23/13 0602  BP: 94/58 82/50 83/53  90/57  Pulse: 67 63 63 67  Temp: 98.3 F (36.8 C)     TempSrc:      Resp: 18 18 18 18   Height:      Weight:      SpO2: 96% 96%      lungs:   clear to auscultation cor:    RRR Abdomen:  soft, appropriate tenderness, incisions intact and without erythema or exudate ex:    no cords   Lab Results  Component Value Date   WBC 10.5 12/23/2013   HGB 12.2 12/23/2013   HCT 35.5* 12/23/2013   MCV 91.5 12/23/2013   PLT 185 12/23/2013    --/--/O POS (02/25 0942)/RI  A/P    Post operative day 1.  Routine post op and postpartum care.  Expect d/c tomorrow.  Percocet for pain control.

## 2013-12-24 MED ORDER — OXYCODONE-ACETAMINOPHEN 5-325 MG PO TABS: 1.0000 | ORAL_TABLET | ORAL | Status: DC | PRN

## 2013-12-24 NOTE — Lactation Note (Signed)
This note was copied from the chart of [name]. P2 stating has no concerns at this time. LC very helpful yesterday, using comfort gels after feedings. States only sore from the first day, has good latch now and no discomfort noted. Encour[facility] Services if any concern occur. Referred to Baby and Me book for engorgement issues or concerns.

## 2013-12-24 NOTE — Discharge Summary (Signed)
Obstetric Discharge Summary Reason for Admission: rupture of membranes Prenatal Procedures: ultrasound Intrapartum Procedures: cesarean: low cervical, transverse Postpartum Procedures: none Complications-Operative and Postpartum: none Hemoglobin  Date Value Ref Range Status  12/23/2013 12.2  12.0 - 15.0 g/dL Final     HCT  Date Value Ref Range Status  12/23/2013 35.5* 36.0 - 46.0 % Final    Physical Exam:  General: alert Lochia: appropriate Uterine Fundus: firm Incision: healing well DVT Evaluation: No evidence of DVT seen on physical exam.  Discharge Diagnoses: Term Pregnancy-delivered and SROM, and h.o. prior C/S , declines VBAC  Discharge Information: Date: 12/24/2013 Activity: pelvic rest Diet: routine Medications: PNV, Ibuprofen and Percocet Condition: stable Instructions: refer to practice specific booklet Discharge to: home Follow-up Information   Follow up with Almon HerculesOSS,KENDRA H., MD. Schedule an appointment as soon as possible for a visit in 4 weeks.   Specialty:  Obstetrics and Gynecology   Contact information:   [address] [place] [id number] [phone]       Newborn Data: Live born female  Birth Weight: 7 lb 10.9 oz (3485 g) APGAR: 9, 9  Home with mother.  Turquoise Esch E 12/24/2013, 8:32 AM

## 2013-12-24 NOTE — Progress Notes (Signed)
POD#2 Pt and baby are doing well. Would like to go home.  VSSAF IMP/ stable Plan/ Will discharge to home.

## 2013-12-27 ENCOUNTER — Inpatient Hospital Stay (HOSPITAL_COMMUNITY): Admission: RE | Admit: 2013-12-27 | Payer: BC Managed Care – PPO | Source: Ambulatory Visit

## 2014-08-16 ENCOUNTER — Encounter: Payer: Self-pay | Admitting: Family Medicine

## 2014-08-16 ENCOUNTER — Encounter (INDEPENDENT_AMBULATORY_CARE_PROVIDER_SITE_OTHER): Payer: Self-pay

## 2014-08-16 ENCOUNTER — Ambulatory Visit (INDEPENDENT_AMBULATORY_CARE_PROVIDER_SITE_OTHER): Payer: BC Managed Care – PPO | Admitting: Family Medicine

## 2014-08-16 VITALS — BP 128/86 | HR 62 | Temp 98.9°F | Ht 60.0 in | Wt 151.5 lb

## 2014-08-16 DIAGNOSIS — J309 Allergic rhinitis, unspecified: Secondary | ICD-10-CM | POA: Insufficient documentation

## 2014-08-16 DIAGNOSIS — T50Z95A Adverse effect of other vaccines and biological substances, initial encounter: Secondary | ICD-10-CM | POA: Insufficient documentation

## 2014-08-16 DIAGNOSIS — T50Z95S Adverse effect of other vaccines and biological substances, sequela: Secondary | ICD-10-CM

## 2014-08-16 DIAGNOSIS — J302 Other seasonal allergic rhinitis: Secondary | ICD-10-CM

## 2014-08-16 NOTE — Assessment & Plan Note (Signed)
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

## 2014-08-16 NOTE — Patient Instructions (Signed)
When you are no longer nursing - to treat allergies - claritin or allegra or zyrtec all work well - you may have to interchange them occasionally  Please send for last note/lab/imms from Dr [name]

## 2014-08-16 NOTE — Assessment & Plan Note (Signed)
Pt chooses not to treat while nursing  Disc options after she is done - can try claritin or allegra (used zyrtec in the past and it stopped working)  Disc need to switch occasionally  flonase is otc if needed for congestion  Disc allergen avoidance

## 2014-08-16 NOTE — Progress Notes (Signed)
Pre visit review using our clinic review tool, if applicable. No additional management support is needed unless otherwise documented below in the visit note. 

## 2014-08-16 NOTE — Progress Notes (Signed)
Subjective:    Patient ID: [name], female    DOB: 07/15/1986, 28 y.o.   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 Cesarean delivery delivered 12/22/2013   Past Medical History  Diagnosis Date  . Pneumonia 1992    pt was in first grade; treated  . GERD (gastroesophageal reflux disease)     just with pregnancy; Tums help   . History of chicken pox   . History of hay fever    Past Surgical History  Procedure Laterality Date  . Wisdom tooth extraction  1999  . Cesarean section  01/23/2012    Procedure: CESAREAN SECTION;  Surgeon: Breanna MarchKendra H. Tenny Crawoss, MD;  Location: [facility];  Service: Gynecology;  Laterality: N/A;  . Cesarean section N/A 12/22/2013    Procedure: CESAREAN SECTION;  Surgeon: Breanna MarchKendra H. Tenny Crawoss, MD;  Location: [facility];  Service: Obstetrics;  Laterality: N/A;   History  Substance Use Topics  . Smoking status: Never Smoker   . Smokeless tobacco: Never Used  . Alcohol Use: Yes     Comment: occ/rare   Family History  Problem Relation Age of Onset  . Alcohol abuse Maternal Grandfather   . Hypertension Mother   . Hypertension Father   . Hypertension Other     [employer] Uncle  . Stroke Maternal Grandfather   . Stroke Other    [employer] Uncle  . Sudden death Maternal Uncle     brain aneurysm  . Diabetes Maternal Grandmother    Allergies  Allergen Reactions  . Meningococcal Vaccines Shortness Of Breath and Other (See Comments)    Husband did not know reaction - he just knew she was allergic to it   Current Outpatient Prescriptions on File Prior to Visit  Medication Sig Dispense Refill  . Prenatal Vit-Fe Fumarate-FA (PRENATAL MULTIVITAMIN) TABS Take 1 tablet by mouth daily.       No current facility-administered medications on file prior to visit.      Review of Systems Review of Systems  Constitutional: Negative for fever, appetite change, fatigue and unexpected weight change.  Eyes: Negative for pain and visual disturbance.  ENT pos for runny nose and sneezing  Respiratory: Negative for cough and shortness of breath.   Cardiovascular: Negative for cp or palpitations    Gastrointestinal: Negative for nausea, diarrhea and constipation.  Genitourinary: Negative for urgency and frequency.  Skin: Negative for pallor or rash   Neurological: Negative for weakness, light-headedness, numbness and headaches.  Hematological: Negative for adenopathy. Does not bruise/bleed easily.  Psychiatric/Behavioral: Negative for dysphoric mood. The patient is not nervous/anxious.         Objective:   Physical Exam  Constitutional: She appears well-developed and well-nourished. No distress.  HENT:  Head: Normocephalic and atraumatic.  Right Ear: External ear normal.  Left Ear: External ear normal.  Nose: Nose normal.  Mouth/Throat: Oropharynx is clear and moist.  Nares are boggy  Some clear rhinorrhea No sinus tenderness  Eyes: Conjunctivae and EOM are normal. Pupils are equal, round, and reactive to light. Right eye exhibits no discharge. Left eye exhibits no discharge. No scleral icterus.  Neck: Normal range of motion. Neck supple. No JVD present. No thyromegaly present.  Cardiovascular: Normal rate, regular rhythm,  normal heart sounds and intact distal pulses.  Exam reveals no gallop.   Pulmonary/Chest: Effort normal and breath sounds normal. No respiratory distress. She has no wheezes. She has no rales.  Abdominal: Soft. Bowel sounds are normal. She exhibits no distension and no mass. There is no tenderness.  Musculoskeletal: She exhibits no edema and no tenderness.  Lymphadenopathy:    She has no cervical adenopathy.  Neurological: She is alert. She has normal reflexes. She exhibits normal muscle tone. Coordination normal.  Skin: Skin is warm and dry. No rash noted. No erythema. No pallor.  Psychiatric: She has a normal mood and affect.          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

## 2014-08-29 ENCOUNTER — Encounter: Payer: Self-pay | Admitting: Family Medicine

## 2014-09-21 ENCOUNTER — Encounter: Payer: Self-pay | Admitting: Internal Medicine

## 2014-09-21 ENCOUNTER — Ambulatory Visit (INDEPENDENT_AMBULATORY_CARE_PROVIDER_SITE_OTHER): Payer: BC Managed Care – PPO | Admitting: Internal Medicine

## 2014-09-21 VITALS — BP 114/78 | HR 76 | Temp 99.5°F | Wt 148.5 lb

## 2014-09-21 DIAGNOSIS — J029 Acute pharyngitis, unspecified: Secondary | ICD-10-CM

## 2014-09-21 MED ORDER — AMOXICILLIN 500 MG PO CAPS: 500.0000 mg | ORAL_CAPSULE | Freq: Three times a day (TID) | ORAL | Status: DC

## 2014-09-21 NOTE — Patient Instructions (Signed)

## 2014-09-21 NOTE — Progress Notes (Signed)
Pre visit review using our clinic review tool, if applicable. No additional management support is needed unless otherwise documented below in the visit note. 

## 2014-09-21 NOTE — Progress Notes (Signed)
Subjective:    Patient ID: [name], female    DOB: 01/04/1986, 28 y.o.   MRN: [id number]  HPI  Pt presents to the clinic today with c/o sore throat. She reports this started yesterday. She has run a fever up to 99.8. She has also noted white patches on her throat and has pain with swallowing. She has not tried anything OTC. She has had sick contacts.  Review of Systems      Past Medical History  Diagnosis Date  . Pneumonia 1992    pt was in first grade; treated  . GERD (gastroesophageal reflux disease)     just with pregnancy; Tums help   . History of chicken pox   . History of hay fever     Current Outpatient Prescriptions  Medication Sig Dispense Refill  . Prenatal Vit-Fe Fumarate-FA (PRENATAL MULTIVITAMIN) TABS Take 1 tablet by mouth daily.     No current facility-administered medications for this visit.    Allergies  Allergen Reactions  . Meningococcal Vaccines Shortness Of Breath and Other (See Comments)    Husband did not know reaction - he just knew she was allergic to it    Family History  Problem Relation Age of Onset  . Alcohol abuse Maternal Grandfather   . Hypertension Mother   . Hypertension Father   . Hypertension Other     [employer] Uncle  . Stroke Maternal Grandfather   . Stroke Other     [employer] Uncle  . Sudden death Maternal Uncle     brain aneurysm  . Diabetes Maternal Grandmother     History   Social History  . Marital Status: Married    Spouse Name: N/A    Number of Children: N/A  . Years of Education: N/A   Occupational History  . Not on file.   Social History Main Topics  . Smoking status: Never Smoker   . Smokeless tobacco: Never Used  . Alcohol Use: Yes     Comment: occ/rare  . Drug Use: No  . Sexual Activity: Yes   Other Topics Concern  . Not on file   Social History Narrative     Constitutional: Denies fever, malaise, fatigue, headache or abrupt weight changes.  HEENT: Pt reports sore throat. Denies eye pain,  eye redness, ear pain, ringing in the ears, wax buildup, runny nose, nasal congestion, bloody nose. Respiratory: Denies difficulty breathing, shortness of breath, cough or sputum production.   Cardiovascular: Denies chest pain, chest tightness, palpitations or swelling in the hands or feet.    No other specific complaints in a complete review of systems (except as listed in HPI above).  Objective:   Physical Exam   BP 114/78 mmHg  Pulse 76  Temp(Src) 99.5 F (37.5 C) (Oral)  Wt 148 lb 8 oz (67.359 kg)  SpO2 99%  Breastfeeding? Yes Wt Readings from Last 3 Encounters:  09/21/14 148 lb 8 oz (67.359 kg)  08/16/14 151 lb 8 oz (68.72 kg)  12/22/13 178 lb 12.8 oz (81.103 kg)    General: Appears her stated age, well developed, well nourished in NAD. HEENT: Head: normal shape and size; Throat/Mouth: Teeth present, mucosa pink and moist, no exudate, lesions or ulcerations noted.  Cardiovascular: Normal rate and rhythm. S1,S2 noted.  No murmur, rubs or gallops noted.  Pulmonary/Chest: Normal effort and positive vesicular breath sounds. No respiratory distress. No wheezes, rales or ronchi noted.   Assessment & Plan:   Sore throat:  RST: negative Try  ibuprofen and salt water gargles If persist or worsens in the next 2 days, start Amoxil TID x 10 days  RTC as needed or if symptoms persist or worsen

## 2014-09-23 ENCOUNTER — Ambulatory Visit: Payer: BC Managed Care – PPO | Admitting: Family Medicine

## 2014-11-17 ENCOUNTER — Emergency Department: Payer: Self-pay | Admitting: Emergency Medicine

## 2014-11-17 LAB — CBC WITH DIFFERENTIAL/PLATELET
BASOS ABS: 0.1 10*3/uL (ref 0.0–0.1)
Basophil %: 1 %
EOS ABS: 0.2 10*3/uL (ref 0.0–0.7)
Eosinophil %: 1.6 %
HCT: 42.7 % (ref 35.0–47.0)
HGB: 14.1 g/dL (ref 12.0–16.0)
Lymphocyte #: 3.1 10*3/uL (ref 1.0–3.6)
Lymphocyte %: 28 %
MCH: 29.9 pg (ref 26.0–34.0)
MCHC: 33 g/dL (ref 32.0–36.0)
MCV: 91 fL (ref 80–100)
Monocyte #: 0.7 x10 3/mm (ref 0.2–0.9)
Monocyte %: 5.9 %
NEUTROS PCT: 63.5 %
Neutrophil #: 7 10*3/uL — ABNORMAL HIGH (ref 1.4–6.5)
Platelet: 201 10*3/uL (ref 150–440)
RBC: 4.72 10*6/uL (ref 3.80–5.20)
RDW: 13 % (ref 11.5–14.5)
WBC: 11.1 10*3/uL — ABNORMAL HIGH (ref 3.6–11.0)

## 2014-11-17 LAB — URINALYSIS, COMPLETE
BILIRUBIN, UR: NEGATIVE
GLUCOSE, UR: NEGATIVE mg/dL (ref 0–75)
Ketone: NEGATIVE
Leukocyte Esterase: NEGATIVE
NITRITE: NEGATIVE
PH: 5 (ref 4.5–8.0)
Protein: NEGATIVE
RBC,UR: NONE SEEN /HPF (ref 0–5)
Specific Gravity: 1.016 (ref 1.003–1.030)
WBC UR: <1 /HPF (ref 0–5)

## 2014-11-17 LAB — COMPREHENSIVE METABOLIC PANEL
ALK PHOS: 72 U/L
Albumin: 3.7 g/dL (ref 3.4–5.0)
Anion Gap: 7 (ref 7–16)
BUN: 12 mg/dL (ref 7–18)
Bilirubin,Total: 0.3 mg/dL (ref 0.2–1.0)
CALCIUM: 9.2 mg/dL (ref 8.5–10.1)
CHLORIDE: 105 mmol/L (ref 98–107)
CO2: 29 mmol/L (ref 21–32)
Creatinine: 0.73 mg/dL (ref 0.60–1.30)
EGFR (African American): >60
Glucose: 98 mg/dL (ref 65–99)
OSMOLALITY: 281 (ref 275–301)
Potassium: 3.9 mmol/L (ref 3.5–5.1)
SGOT(AST): 16 U/L (ref 15–37)
SGPT (ALT): 13 U/L — ABNORMAL LOW
SODIUM: 141 mmol/L (ref 136–145)
Total Protein: 7.3 g/dL (ref 6.4–8.2)

## 2014-11-17 LAB — TROPONIN I: Troponin-I: <0.02 ng/mL

## 2014-11-17 LAB — LIPASE, BLOOD: LIPASE: 79 U/L (ref 73–393)

## 2014-11-21 ENCOUNTER — Encounter: Payer: Self-pay | Admitting: Family Medicine

## 2014-11-21 ENCOUNTER — Ambulatory Visit (INDEPENDENT_AMBULATORY_CARE_PROVIDER_SITE_OTHER): Payer: BC Managed Care – PPO | Admitting: Family Medicine

## 2014-11-21 VITALS — BP 120/68 | HR 76 | Temp 98.4°F | Ht 60.0 in | Wt 152.8 lb

## 2014-11-21 DIAGNOSIS — R1013 Epigastric pain: Secondary | ICD-10-CM

## 2014-11-21 DIAGNOSIS — M545 Low back pain, unspecified: Secondary | ICD-10-CM | POA: Insufficient documentation

## 2014-11-21 LAB — HEPATIC FUNCTION PANEL
ALBUMIN: 4.1 g/dL (ref 3.5–5.2)
ALT: 8 U/L (ref 0–35)
AST: 12 U/L (ref 0–37)
Alkaline Phosphatase: 61 U/L (ref 39–117)
Bilirubin, Direct: 0 mg/dL (ref 0.0–0.3)
TOTAL PROTEIN: 7.2 g/dL (ref 6.0–8.3)
Total Bilirubin: 0.4 mg/dL (ref 0.2–1.2)

## 2014-11-21 LAB — CBC WITH DIFFERENTIAL/PLATELET
BASOS PCT: 0.5 % (ref 0.0–3.0)
Basophils Absolute: 0 10*3/uL (ref 0.0–0.1)
Eosinophils Absolute: 0.2 10*3/uL (ref 0.0–0.7)
Eosinophils Relative: 2.3 % (ref 0.0–5.0)
HCT: 42 % (ref 36.0–46.0)
Hemoglobin: 14.5 g/dL (ref 12.0–15.0)
LYMPHS PCT: 25.5 % (ref 12.0–46.0)
Lymphs Abs: 2.2 10*3/uL (ref 0.7–4.0)
MCHC: 34.5 g/dL (ref 30.0–36.0)
MCV: 87.3 fl (ref 78.0–100.0)
Monocytes Absolute: 0.5 10*3/uL (ref 0.1–1.0)
Monocytes Relative: 5.4 % (ref 3.0–12.0)
Neutro Abs: 5.8 10*3/uL (ref 1.4–7.7)
Neutrophils Relative %: 66.3 % (ref 43.0–77.0)
Platelets: 211 10*3/uL (ref 150.0–400.0)
RBC: 4.81 Mil/uL (ref 3.87–5.11)
RDW: 12.9 % (ref 11.5–15.5)
WBC: 8.8 10*3/uL (ref 4.0–10.5)

## 2014-11-21 LAB — AMYLASE: Amylase: 65 U/L (ref 27–131)

## 2014-11-21 LAB — LIPASE: LIPASE: 9 U/L — AB (ref 11.0–59.0)

## 2014-11-21 NOTE — Progress Notes (Signed)
Pre visit review using our clinic review tool, if applicable. No additional management support is needed unless otherwise documented below in the visit note. 

## 2014-11-21 NOTE — Assessment & Plan Note (Signed)
Began with abd pain ? If rel  Per pt ua in hosp was clear   See assessment above- rev ER records/ lab today May consider CT to r/o colitis or other

## 2014-11-21 NOTE — Assessment & Plan Note (Signed)
Rev hx per pt - req records [facility]  Per pt she had xrays and ultasound and labs Check lab today - cbc/hepatic/amylase/lip  Puzzling-also has low back pain -midline   Will rev lab  Will rev ED records Then consider CT scan if not imp   Disc poss of gastritis-will add prilosec 20 mg to current meds as well

## 2014-11-21 NOTE — Progress Notes (Signed)
Subjective:    Patient ID: [name], female    DOB: 03/12/1986, 29 y.o.   MRN: [id number]  HPI Here for f/u of ED visit   She ate [employer] food - carry out - on Thursday midday  Then had a lot of abdominal pressure /bloating/pain and some diarrhea and some nausea  ----she went to [facility] that night -- they did xrays and they looked ok  Dx : was poss esophagitis or pancreatitis  They sent her to ER Fri 1 am - pain went up to her epigastric area  US - liver and gallbladder and blood work  - told her she may have a stomach ulcer or esophagitis  Now pain goes to her low back   Has not had a CT scan  She was given famotidine 20 mg bid  Also zofran for nausea -just took one for the first time and it did help  Bentyl for cramping - that is helping   Patient Active Problem List   Diagnosis Date Noted  . Allergic rhinitis 08/16/2014  . Vaccine reaction 08/16/2014  . Cesarean delivery delivered 12/22/2013   Past Medical History  Diagnosis Date  . Pneumonia 1992    pt was in first grade; treated  . GERD (gastroesophageal reflux disease)     just with pregnancy; Tums help   . History of chicken pox   . History of hay fever    Past Surgical History  Procedure Laterality Date  . Wisdom tooth extraction  1999  . Cesarean section  01/23/2012    Procedure: CESAREAN SECTION;  Surgeon: Freddrick MarchKendra H. Tenny Crawoss, MD;  Location: [facility];  Service: Gynecology;  Laterality: N/A;  . Cesarean section N/A 12/22/2013    Procedure: CESAREAN SECTION;  Surgeon: Freddrick MarchKendra H. Tenny Crawoss, MD;  Location: [facility];  Service: Obstetrics;  Laterality: N/A;   History  Substance Use Topics  . Smoking status: Never Smoker   . Smokeless tobacco: Never Used  . Alcohol Use: Yes     Comment: occ/rare   Family History  Problem Relation Age of Onset  . Alcohol abuse Maternal Grandfather   . Hypertension Mother   . Hypertension Father   . Hypertension Other     [employer] Uncle  . Stroke Maternal Grandfather   . Stroke Other    [employer] Uncle  . Sudden death Maternal Uncle     brain aneurysm  . Diabetes Maternal Grandmother    Allergies  Allergen Reactions  . Meningococcal Vaccines Shortness Of Breath and Other (See Comments)    Husband did not know reaction - he just knew she was allergic to it   No current outpatient prescriptions on file prior to visit.   No current facility-administered medications on file prior to visit.      Review of Systems Review of Systems  Constitutional: Negative for fever, appetite change, fatigue and unexpected weight change.  Eyes: Negative for pain and visual disturbance.  Respiratory: Negative for cough and shortness of breath.   Cardiovascular: Negative for cp or palpitations    Gastrointestinal: Negative for, diarrhea and constipation. neg for blood in stool or dark stool  Genitourinary: Negative for urgency and frequency. neg for blood in urine or flank pain  Skin: Negative for pallor or rash   Neurological: Negative for weakness, light-headedness, numbness and headaches.  Hematological: Negative for adenopathy. Does not bruise/bleed easily.  Psychiatric/Behavioral: Negative for dysphoric mood. The patient is not nervous/anxious.         Objective:  Physical Exam  Constitutional: She appears well-developed and well-nourished. No distress.  overwt and well appearing   HENT:  Head: Normocephalic and atraumatic.  Mouth/Throat: Oropharynx is clear and moist.  Eyes: Conjunctivae and EOM are normal. Pupils are equal, round, and reactive to light. No scleral icterus.  Neck: Normal range of motion. Neck supple. No JVD present. No thyromegaly present.  Cardiovascular: Normal rate, regular rhythm and normal heart sounds.   Pulmonary/Chest: Effort normal and breath sounds normal. No respiratory distress. She has no wheezes. She has no rales.  Abdominal: Soft. Bowel sounds are normal. She exhibits no distension and no mass. There is no hepatosplenomegaly. There is tenderness  in the epigastric area. There is no rigidity, no rebound, no guarding, no CVA tenderness, no tenderness at McBurney's point and negative Murphy's sign.  Musculoskeletal: She exhibits no edema.  Lymphadenopathy:    She has no cervical adenopathy.  Neurological: She is alert.  Skin: Skin is warm and dry. No rash noted. No erythema. No pallor.  Psychiatric: She has a normal mood and affect.          Assessment & Plan:   Problem List Items Addressed This Visit      Other   Abdominal pain, epigastric - Primary    Rev hx per pt - req records from [facility] ER  Per pt she had xrays and ultasound and labs Check lab today - cbc/hepatic/amylase/lip  Puzzling-also has low back pain -midline   Will rev lab  Will rev ED records Then consider CT scan if not imp   Disc poss of gastritis-will add prilosec 20 mg to current meds as well       Relevant Orders   CBC with Differential/Platelet   Hepatic function panel   Basic metabolic panel   Amylase   Lipase   Low back pain    Began with abd pain ? If rel  Per pt ua in hosp was clear   See assessment above- rev ER records/ lab today May consider CT to r/o colitis or other

## 2014-11-21 NOTE — Patient Instructions (Signed)
Labs now  Get over the counter prilosec OTC 20 mg and take one daily in addition to other medicines  We will send for records from [facility]  We may need to order a Cat scan depending on what I review  In the meantime if worse -inform us or go to ER if necessary

## 2014-11-22 ENCOUNTER — Telehealth: Payer: Self-pay | Admitting: Family Medicine

## 2014-11-22 DIAGNOSIS — R1030 Lower abdominal pain, unspecified: Secondary | ICD-10-CM | POA: Insufficient documentation

## 2014-11-22 DIAGNOSIS — R1013 Epigastric pain: Secondary | ICD-10-CM

## 2014-11-22 NOTE — Telephone Encounter (Signed)
-----   Message from [place], [place] sent at 11/22/2014 10:29 AM EST ----- Spoken to patient and patient agree to the referral for CT scan.

## 2014-11-22 NOTE — Telephone Encounter (Signed)
I spoke to pt -her abd pain got very bad earlier today after eating a salad- then had diarrhea and pain decreased a bit  She wants to stay out of the ED if possible and thinks she does not need to go at this point   I inst her to get prilosec otc 20 mg and take one now  She can also try a liquid antacid an hour later if needed If pain gets severe tonight-will go to ED  Otherwise we will call her to set up CT scan tomorrow  -I will ask [facility] to bump that up to urgent

## 2014-11-22 NOTE — Telephone Encounter (Signed)
Pt calls in to update on condition. Pt feels like this issue is getting worse and she is in a lot of pain. She is requesting a call back asap.

## 2014-11-22 NOTE — Telephone Encounter (Signed)
Referred for CT - will route to [employer]  Thanks

## 2014-11-23 ENCOUNTER — Ambulatory Visit (INDEPENDENT_AMBULATORY_CARE_PROVIDER_SITE_OTHER)
Admission: RE | Admit: 2014-11-23 | Discharge: 2014-11-23 | Disposition: A | Discharge status: Home / Self Care | Payer: BC Managed Care – PPO | Source: Ambulatory Visit | Attending: Family Medicine | Admitting: Family Medicine

## 2014-11-23 ENCOUNTER — Telehealth: Payer: Self-pay | Admitting: Family Medicine

## 2014-11-23 DIAGNOSIS — R1030 Lower abdominal pain, unspecified: Secondary | ICD-10-CM

## 2014-11-23 DIAGNOSIS — R197 Diarrhea, unspecified: Secondary | ICD-10-CM

## 2014-11-23 DIAGNOSIS — R1013 Epigastric pain: Secondary | ICD-10-CM

## 2014-11-23 MED ORDER — IOHEXOL 300 MG/ML  SOLN
100.0000 mL | Freq: Once | INTRAMUSCULAR | Status: AC | PRN
  Administered 2014-11-23: 100 mL via INTRAVENOUS

## 2014-11-23 NOTE — Telephone Encounter (Signed)
I commented on results -please give her a call, thanks- let me know how she is doing

## 2014-11-23 NOTE — Telephone Encounter (Signed)
-----   Message from [place], [place] sent at 11/23/2014  5:13 PM EST ----- Spoken to patient and she stated that there is no improvement. Inform her of Dr. Royden Purlower's comments.

## 2014-11-23 NOTE — Telephone Encounter (Signed)
Please let her know I am going to put through an urgent GI referral  If symptoms worsen/ get severe tonight -go to ED as prev instructed  She will get a call tomorrow re: referral  Thanks

## 2014-11-23 NOTE — Telephone Encounter (Signed)
Pt called checking on ct results  This was done today 11/23/14 @ [facility]

## 2014-11-24 NOTE — Telephone Encounter (Signed)
Called and spoken to patient. Inform her of Dr. Royden Purlower's comments of the urgent referral to GI.

## 2014-11-24 NOTE — Telephone Encounter (Signed)
Appt made with [facility] for Mon 11/28/14 and patient aware.

## 2014-11-28 ENCOUNTER — Ambulatory Visit (INDEPENDENT_AMBULATORY_CARE_PROVIDER_SITE_OTHER): Payer: BC Managed Care – PPO | Admitting: Physician Assistant

## 2014-11-28 ENCOUNTER — Encounter: Payer: Self-pay | Admitting: Physician Assistant

## 2014-11-28 ENCOUNTER — Other Ambulatory Visit (INDEPENDENT_AMBULATORY_CARE_PROVIDER_SITE_OTHER): Payer: BC Managed Care – PPO

## 2014-11-28 VITALS — BP 90/58 | HR 72 | Ht 60.5 in | Wt 152.1 lb

## 2014-11-28 DIAGNOSIS — R1013 Epigastric pain: Secondary | ICD-10-CM

## 2014-11-28 DIAGNOSIS — K529 Noninfective gastroenteritis and colitis, unspecified: Secondary | ICD-10-CM

## 2014-11-28 LAB — COMPREHENSIVE METABOLIC PANEL
ALK PHOS: 71 U/L (ref 39–117)
ALT: 6 U/L (ref 0–35)
AST: 8 U/L (ref 0–37)
Albumin: 4 g/dL (ref 3.5–5.2)
BUN: 15 mg/dL (ref 6–23)
CO2: 29 mEq/L (ref 19–32)
Calcium: 9.2 mg/dL (ref 8.4–10.5)
Chloride: 105 mEq/L (ref 96–112)
Creatinine, Ser: 0.74 mg/dL (ref 0.40–1.20)
GFR: 98.51 mL/min (ref >60.00)
Glucose, Bld: 83 mg/dL (ref 70–99)
Potassium: 4.1 mEq/L (ref 3.5–5.1)
Sodium: 139 mEq/L (ref 135–145)
TOTAL PROTEIN: 7.1 g/dL (ref 6.0–8.3)
Total Bilirubin: 0.3 mg/dL (ref 0.2–1.2)

## 2014-11-28 LAB — AMYLASE: AMYLASE: 76 U/L (ref 27–131)

## 2014-11-28 LAB — LIPASE: LIPASE: 6 U/L — AB (ref 11.0–59.0)

## 2014-11-28 MED ORDER — PANTOPRAZOLE SODIUM 40 MG PO TBEC: 40.0000 mg | DELAYED_RELEASE_TABLET | Freq: Every day | ORAL | Status: DC

## 2014-11-28 MED ORDER — METOCLOPRAMIDE HCL 10 MG PO TABS: ORAL_TABLET | ORAL | Status: DC

## 2014-11-28 NOTE — Progress Notes (Signed)
Patient ID: [name], female   DOB: 11/09/1985, 29 y.o.   MRN: [id number]    HPI:    [name] is a 29 year old female referred for evaluation by Dr. Milinda Antisower due to epigastric abdominal pain.  [name] states that on arm about January 25 she ate [employer] food. She then developed abdominal pressure, bloating, pain, diarrhea, and nausea. She went [facility] care facility and had x-rays and was told it looked okay she was diagnosed with possible esophagitis or pancreatitis area she then went to an emergency room later that night because she had pain in the epigastric area. She had an ultrasound of the liver and gallbladder and along with laboratory work that was all normal. She was told she might have a stomach ulcer or esophagitis. She was started on famotidine 20 mg daily along with Zofran and when necessary Bentyl. She was advised to come here she states that as of today she feels about 90% better. She stayed on a brat diet for several days and over the past couple of days has eaten a regular diet she still has some very mild epigastric pain and nausea in the late evening for which she takes Zofran and feels better she has no heartburn or early satiety but she belches and burps a lot. When she had her pain her pain radiated through her back. She does not have that pain now. She has no dysphagia. She is unable to identify any specific foods that exacerbate her pain. She has had no bright red blood per rectum or melena. She states she was going to cancel her appointment today because she feels so well. She currently has her menses.   Past Medical History  Diagnosis Date  . Pneumonia 1992    pt was in first grade; treated  . GERD (gastroesophageal reflux disease)     just with pregnancy; Tums help   . History of chicken pox   . History of hay fever     Past Surgical History  Procedure Laterality Date  . Wisdom tooth extraction  1999  . Cesarean section  01/23/2012    Procedure: CESAREAN  SECTION;  Surgeon: Freddrick MarchKendra H. Tenny Crawoss, MD;  Location: [facility];  Service: Gynecology;  Laterality: N/A;  . Cesarean section N/A 12/22/2013    Procedure: CESAREAN SECTION;  Surgeon: Freddrick MarchKendra H. Tenny Crawoss, MD;  Location: [facility];  Service: Obstetrics;  Laterality: N/A;   Family History  Problem Relation Age of Onset  . Alcohol abuse Maternal Grandfather   . Hypertension Mother   . Hypertension Father   . Hypertension Other     [employer] Uncle  . Stroke Maternal Grandfather   . Stroke Other     [employer] Uncle  . Sudden death Maternal Uncle     brain aneurysm  . Diabetes Maternal Grandmother   . Colon cancer Neg Hx   . Colon polyps Neg Hx   . Heart disease Neg Hx   . Esophageal cancer Neg Hx   . Gallbladder disease Neg Hx    History  Substance Use Topics  . Smoking status: Never Smoker   . Smokeless tobacco: Never Used  . Alcohol Use: Yes     Comment: occ/rare   Current Outpatient Prescriptions  Medication Sig Dispense Refill  . dicyclomine (BENTYL) 20 MG tablet Take 20 mg by mouth every 6 (six) hours.    . metoCLOPramide (REGLAN) 10 MG tablet Take 1 tablet by mouth before breakfast, dinner and bedtime x 4 days. 12 tablet  0  . pantoprazole (PROTONIX) 40 MG tablet Take 1 tablet (40 mg total) by mouth daily. 30 minutes prior to breakfast 30 tablet 2   No current facility-administered medications for this visit.   Allergies  Allergen Reactions  . Meningococcal Vaccines Shortness Of Breath and Other (See Comments)    Husband did not know reaction - he just knew she was allergic to it     Review of Systems: Gen: Denies any fever, chills, sweats, anorexia, fatigue, weakness, malaise, weight loss, and sleep disorder CV: Denies chest pain, angina, palpitations, syncope, orthopnea, PND, peripheral edema, and claudication. Resp: Denies dyspnea at rest, dyspnea with exercise, cough, sputum, wheezing, coughing up blood, and pleurisy. GI: Denies vomiting blood, jaundice, and fecal incontinence.   Denies  dysphagia or odynophagia. GU : Denies urinary burning, blood in urine, urinary frequency, urinary hesitancy, nocturnal urination, and urinary incontinence. MS: Denies joint pain, limitation of movement, and swelling, stiffness, low back pain, extremity pain. Denies muscle weakness, cramps, atrophy.  Derm: Denies rash, itching, dry skin, hives, moles, warts, or unhealing ulcers.  Psych: Denies depression, anxiety, memory loss, suicidal ideation, hallucinations, paranoia, and confusion. Heme: Denies bruising, bleeding, and enlarged lymph nodes. Neuro:  Denies any headaches, dizziness, paresthesias. Endo:  Denies any problems with DM, thyroid, adrenal function  Studies: Ct Abdomen Pelvis W Contrast  11/23/2014   CLINICAL DATA:  29 year old female with epigastric abdominal pain and flank pain radiating to the low back. Nausea and diarrhea. Initial encounter.  EXAM: CT ABDOMEN AND PELVIS WITH CONTRAST  TECHNIQUE: Multidetector CT imaging of the abdomen and pelvis was performed using the standard protocol following bolus administration of intravenous contrast.  CONTRAST:  OMNIPAQUE IOHEXOL 300 MG/ML  SOLN  COMPARISON:  Right upper quadrant ultrasound 11/17/2014. Ob ultrasound 06/30/2013.  FINDINGS: Negative lung bases.  No pericardial or pleural effusion.  No osseous abnormality identified.  No pelvic free fluid. Uterus and adnexa within normal limits. Decompressed distal colon. Unremarkable bladder.  Oral contrast has reached the sigmoid colon. There is contrast throughout the more proximal colon. No large bowel inflammation. Terminal ileum within normal limits. Normal appendix. No dilated small bowel. Stomach and duodenum are within normal limits.  Liver, gallbladder, spleen, pancreas, and adrenal glands are within normal limits. Portal venous system within normal limits. Major arterial structures in the abdomen and pelvis appear normal. No abdominal free fluid. Normal renal enhancement. No perinephric  stranding or hydronephrosis. No periureteral stranding or hydroureter. No lymphadenopathy. No pneumoperitoneum.  IMPRESSION: Normal CT abdomen and pelvis. No acute or inflammatory process identified.   Electronically Signed   By: Augusto Gamble M.D.   On: 11/23/2014 11:37    LAB RESULTS: Blood work on 11/21/2014 showed an amylase 65 lipase 9, AST 12, ALT 8, total protein 7.2, direct bili 0, total bili 0.4. CBC White blood cell count 8.8, hemoglobin 14.5, hematocrit 42, platelets 211,000.    Physical Exam: BP 90/58 mmHg  Pulse 72  Ht 5' 0.5" (1.537 m)  Wt 152 lb 2 oz (69.003 kg)  BMI 29.21 kg/m2  LMP 11/28/2014 Constitutional: Pleasant,well-developed female in no acute distress. HEENT: Normocephalic and atraumatic. Conjunctivae are normal. No scleral icterus. Neck supple.no thyromegaly  Cardiovascular: Normal rate, regular rhythm.  Pulmonary/chest: Effort normal and breath sounds normal. No wheezing, rales or rhonchi. Abdominal: Soft, nondistended, nontender. Bowel sounds active throughout. There are no masses palpable. No hepatomegaly. Extremities: no edema Lymphadenopathy: No cervical adenopathy noted. Neurological: Alert and oriented to person place and time. Skin: Skin  is warm and dry. No rashes noted. Psychiatric: Normal mood and affect. Behavior is normal.  ASSESSMENT AND PLAN:  29 year old female status post a bout of abdominal pain, nausea, and diarrhea that lasted for several days, referred for evaluation. She may have had a gastrointestinal virus with some transient gastroenteritis from possible GI toxins. At this point we will discontinue famotidine and Zofran. She will be given a trial of pantoprazole 40 mg by mouth 30 minutes prior to breakfast along with Reglan 10 mg 1 tab prior to breakfast, supper, and bedtime for 4 days. She will adhere to a gastroparesis diet. She has been advised to call us in several days and let us know how she is feeling, otherwise she will follow up in  4 weeks. The patient was clear that she is feeling much better and was not at all interested in any endoscopic evaluations unless her symptoms get worse.   Kamauri Kathol, Tollie Pizza PA-C 11/28/2014, 12:25 PM

## 2014-11-28 NOTE — Progress Notes (Signed)
Agree w/ Ms. Hvozdovic's note and mangement.  

## 2014-11-28 NOTE — Patient Instructions (Addendum)
Your physician has requested that you go to the basement for the following lab work before leaving today: CMET, Amylase, lipase  We have given you hemoccult cards to complete at home. Please follow instructions given to you with the kit and mail back to our lab.  Please discontinue famotidine and zofran.  We have sent the following medications to your pharmacy for you to pick up at your convenience: Pantoprazole 40 mg 30 minutes prior to breakfast. Reglan 10 mg-1 tablet prior to breakfast, dinner and bedtime x 4 days.  Please follow up with Arta Bruce, PA-C on Monday, 12/26/14 @ 2:45 pm.  Gastroparesis  Gastroparesis is also called slowed stomach emptying (delayed gastric emptying). It is a condition in which the stomach takes too long to empty its contents. It often happens in people with diabetes.  CAUSES  Gastroparesis happens when nerves to the stomach are damaged or stop working. When the nerves are damaged, the muscles of the stomach and intestines do not work normally. The movement of food is slowed or stopped. High blood glucose (sugar) causes changes in nerves and can damage the blood vessels that carry oxygen and nutrients to the nerves. RISK FACTORS  Diabetes.  Post-viral syndromes.  Eating disorders (anorexia, bulimia).  Surgery on the stomach or vagus nerve.  Gastroesophageal reflux disease (rarely).  Smooth muscle disorders (amyloidosis, scleroderma).  Metabolic disorders, including hypothyroidism.  Parkinson disease. SYMPTOMS   Heartburn.  Feeling sick to your stomach (nausea).  Vomiting of undigested food.  An early feeling of fullness when eating.  Weight loss.  Abdominal bloating.  Erratic blood glucose levels.  Lack of appetite.  Gastroesophageal reflux.  Spasms of the stomach wall. Complications can include:  Bacterial overgrowth in stomach. Food stays in the stomach and can ferment and cause bacteria to grow.  Weight loss due to  difficulty digesting and absorbing nutrients.  Vomiting.  Obstruction in the stomach. Undigested food can harden and cause nausea and vomiting.  Blood glucose fluctuations caused by inconsistent food absorption. DIAGNOSIS  The diagnosis of gastroparesis is confirmed through one or more of the following tests:  Barium X-rays and scans. These tests look at how long it takes for food to move through the stomach.  Gastric manometry. This test measures electrical and muscular activity in the stomach. A thin tube is passed down the throat into the stomach. The tube contains a wire that takes measurements of the stomach's electrical and muscular activity as it digests liquids and solid food.  Endoscopy. This procedure is done with a long, thin tube called an endoscope. It is passed through the mouth and gently down the esophagus into the stomach. This tube helps the caregiver look at the lining of the stomach to check for any abnormalities.  Ultrasonography. This can rule out gallbladder disease or pancreatitis. This test will outline and define the shape of the gallbladder and pancreas. TREATMENT   Treatments may include:  Exercise.  Medicines to control nausea and vomiting.  Medicines to stimulate stomach muscles.  Changes in what and when you eat.  Having smaller meals more often.  Eating low-fiber forms of high-fiber foods, such as eating cooked vegetables instead of raw vegetables.  Eating low-fat foods.  Consuming liquids, which are easier to digest.  In severe cases, feeding tubes and intravenous (IV) feeding may be needed. It is important to note that in most cases, treatment does not cure gastroparesis. It is usually a lasting (chronic) condition. Treatment helps you manage the underlying condition  so that you can be as healthy and comfortable as possible. Other treatments  A gastric neurostimulator has been developed to assist people with gastroparesis. The  battery-operated device is surgically implanted. It emits mild electrical pulses to help improve stomach emptying and to control nausea and vomiting.  The use of botulinum toxin has been shown to improve stomach emptying by decreasing the prolonged contractions of the muscle between the stomach and the small intestine (pyloric sphincter). The benefits are temporary. SEEK MEDICAL CARE IF:   You have diabetes and you are having problems keeping your blood glucose in goal range.  You are having nausea, vomiting, bloating, or early feelings of fullness with eating.  Your symptoms do not change with a change in diet. Document Released: 10/14/2005 Document Revised: 02/08/2013 Document Reviewed: 03/23/2009 [facility] Patient Information 2015 [place], [place]. This information is not intended to replace advice given to you by your health care provider. Make sure you discuss any questions you have with your health care provider.   CC:Dr [employer]

## 2014-12-26 ENCOUNTER — Ambulatory Visit: Payer: BC Managed Care – PPO | Admitting: Physician Assistant

## 2015-01-10 ENCOUNTER — Encounter: Payer: Self-pay | Admitting: Primary Care

## 2015-01-10 ENCOUNTER — Encounter (INDEPENDENT_AMBULATORY_CARE_PROVIDER_SITE_OTHER): Payer: Self-pay

## 2015-01-10 ENCOUNTER — Ambulatory Visit (INDEPENDENT_AMBULATORY_CARE_PROVIDER_SITE_OTHER): Payer: BC Managed Care – PPO | Admitting: Primary Care

## 2015-01-10 VITALS — BP 114/64 | HR 92 | Temp 100.2°F | Ht 60.5 in | Wt 151.0 lb

## 2015-01-10 DIAGNOSIS — J020 Streptococcal pharyngitis: Secondary | ICD-10-CM

## 2015-01-10 LAB — POCT RAPID STREP A (OFFICE): Rapid Strep A Screen: POSITIVE — AB

## 2015-01-10 MED ORDER — AMOXICILLIN 500 MG PO CAPS: 500.0000 mg | ORAL_CAPSULE | Freq: Two times a day (BID) | ORAL | Status: DC

## 2015-01-10 NOTE — Patient Instructions (Addendum)
Start Amoxicillin twice daily for 10 days. Ibuprofen or Tylenol for pain/fevers. Warm salt gargles, OTC chloraseptic spray as needed.  Strep Throat Strep throat is an infection of the throat caused by a bacteria named Streptococcus pyogenes. Your health care provider may call the infection streptococcal "tonsillitis" or "pharyngitis" depending on whether there are signs of inflammation in the tonsils or back of the throat. Strep throat is most common in children aged 5-15 years during the cold months of the year, but it can occur in people of any age during any season. This infection is spread from person to person (contagious) through coughing, sneezing, or other close contact. SIGNS AND SYMPTOMS   Fever or chills.  Painful, swollen, red tonsils or throat.  Pain or difficulty when swallowing.  White or yellow spots on the tonsils or throat.  Swollen, tender lymph nodes or "glands" of the neck or under the jaw.  Red rash all over the body (rare). DIAGNOSIS  Many different infections can cause the same symptoms. A test must be done to confirm the diagnosis so the right treatment can be given. A "rapid strep test" can help your health care provider make the diagnosis in a few minutes. If this test is not available, a light swab of the infected area can be used for a throat culture test. If a throat culture test is done, results are usually available in a day or two. TREATMENT  Strep throat is treated with antibiotic medicine. HOME CARE INSTRUCTIONS   Gargle with 1 tsp of salt in 1 cup of warm water, 3-4 times per day or as needed for comfort.  Family members who also have a sore throat or fever should be tested for strep throat and treated with antibiotics if they have the strep infection.  Make sure everyone in your household washes their hands well.  Do not share food, drinking cups, or personal items that could cause the infection to spread to others.  You may need to eat a soft  food diet until your sore throat gets better.  Drink enough water and fluids to keep your urine clear or pale yellow. This will help prevent dehydration.  Get plenty of rest.  Stay home from school, day care, or work until you have been on antibiotics for 24 hours.  Take medicines only as directed by your health care provider.  Take your antibiotic medicine as directed by your health care provider. Finish it even if you start to feel better. SEEK MEDICAL CARE IF:   The glands in your neck continue to enlarge.  You develop a rash, cough, or earache.  You cough up green, yellow-brown, or bloody sputum.  You have pain or discomfort not controlled by medicines.  Your problems seem to be getting worse rather than better.  You have a fever. SEEK IMMEDIATE MEDICAL CARE IF:   You develop any new symptoms such as vomiting, severe headache, stiff or painful neck, chest pain, shortness of breath, or trouble swallowing.  You develop severe throat pain, drooling, or changes in your voice.  You develop swelling of the neck, or the skin on the neck becomes red and tender.  You develop signs of dehydration, such as fatigue, dry mouth, and decreased urination.  You become increasingly sleepy, or you cannot wake up completely. MAKE SURE YOU:  Understand these instructions.  Will watch your condition.  Will get help right away if you are not doing well or get worse. Document Released: 10/11/2000 Document Revised: 02/28/2014  Document Reviewed: 12/13/2010 [facility] Patient Information 2015 [place]. This information is not intended to replace advice given to you by your health care provider. Make sure you discuss any questions you have with your health care provider.

## 2015-01-10 NOTE — Progress Notes (Signed)
Pre visit review using our clinic review tool, if applicable. No additional management support is needed unless otherwise documented below in the visit note. 

## 2015-01-10 NOTE — Assessment & Plan Note (Signed)
Positive rapid strep. Will send for culture. RX for Amoxicilling BID for 10 days. Ibuprofen/tylenol for pain/fevers, warm salt gargles, chloraseptic spray. Follow up as needed.

## 2015-01-10 NOTE — Progress Notes (Signed)
Subjective:    Patient ID: [name], female    DOB: [birth year], 29 y.o.   MRN: [id number]  HPI  [name] is a 29 year old female who presents today with a chief complaint of sore throat. She first noticed this last night and experienced symptoms of chills, body aches, and now fever. She has a history of strep throat and will typically test positive twice a year with the last time being Thanksgiving. She has taken ibuprofen last night which helped with pain. Nothing makes her symptoms worse. Also reports white patches to posterior pharynx.  Review of Systems  Constitutional: Positive for fever, chills and fatigue.  HENT: Positive for ear pain and trouble swallowing. Negative for congestion and rhinorrhea.   Respiratory: Negative for cough and shortness of breath.   Cardiovascular: Negative for chest pain.  Gastrointestinal: Negative for nausea and vomiting.  Musculoskeletal: Positive for myalgias.  Skin: Negative for pallor.  Neurological: Negative for dizziness and headaches.  Hematological: Positive for adenopathy.       Past Medical History  Diagnosis Date  . Pneumonia 1992    pt was in first grade; treated  . GERD (gastroesophageal reflux disease)     just with pregnancy; Tums help   . History of chicken pox   . History of hay fever     History   Social History  . Marital Status: Married    Spouse Name: N/A  . Number of Children: 2  . Years of Education: N/A   Occupational History  . Teacher    Social History Main Topics  . Smoking status: Never Smoker   . Smokeless tobacco: Never Used  . Alcohol Use: Yes     Comment: occ/rare  . Drug Use: No  . Sexual Activity: Yes   Other Topics Concern  . Not on file   Social History Narrative    Past Surgical History  Procedure Laterality Date  . Wisdom tooth extraction  1999  . Cesarean section  01/23/2012    Procedure: CESAREAN SECTION;  Surgeon: Freddrick March. Tenny Craw, MD;  Location: [facility];  Service:  Gynecology;  Laterality: N/A;  . Cesarean section N/A 12/22/2013    Procedure: CESAREAN SECTION;  Surgeon: Freddrick March. Tenny Craw, MD;  Location: [facility];  Service: Obstetrics;  Laterality: N/A;    Family History  Problem Relation Age of Onset  . Alcohol abuse Maternal Grandfather   . Hypertension Mother   . Hypertension Father   . Hypertension Other     [employer]  . Stroke Maternal Grandfather   . Stroke Other     [employer]  . Sudden death Maternal Uncle     brain aneurysm  . Diabetes Maternal Grandmother   . Colon cancer Neg Hx   . Colon polyps Neg Hx   . Heart disease Neg Hx   . Esophageal cancer Neg Hx   . Gallbladder disease Neg Hx     Allergies  Allergen Reactions  . Meningococcal Vaccines Shortness Of Breath and Other (See Comments)    Husband did not know reaction - he just knew she was allergic to it    No current outpatient prescriptions on file prior to visit.   No current facility-administered medications on file prior to visit.    BP 114/64 mmHg  Pulse 92  Temp(Src) 100.2 F (37.9 C) (Oral)  Ht 5' 0.5" (1.537 m)  Wt 151 lb (68.493 kg)  BMI 28.99 kg/m2  SpO2 98%  LMP 01/03/2015  Objective:   Physical Exam  Constitutional: She is oriented to person, place, and time. She appears well-developed.  HENT:  Head: Normocephalic.  Right Ear: A middle ear effusion is present.  Left Ear: A middle ear effusion is present.  Nose: Nose normal.  Mouth/Throat: Uvula is midline. Oropharyngeal exudate and posterior oropharyngeal erythema present.  Eyes: Conjunctivae are normal. Pupils are equal, round, and reactive to light.  Neck: Neck supple.  Cardiovascular: Normal rate and regular rhythm.   Pulmonary/Chest: Effort normal and breath sounds normal.  Lymphadenopathy:    She has cervical adenopathy.  Neurological: She is alert and oriented to person, place, and time.  Skin: Skin is warm and dry.  Psychiatric: She has a normal mood and affect.            Assessment & Plan:

## 2015-07-31 ENCOUNTER — Ambulatory Visit (INDEPENDENT_AMBULATORY_CARE_PROVIDER_SITE_OTHER)
Admission: RE | Admit: 2015-07-31 | Discharge: 2015-07-31 | Disposition: A | Discharge status: Home / Self Care | Payer: BC Managed Care – PPO | Source: Ambulatory Visit | Attending: Family Medicine | Admitting: Family Medicine

## 2015-07-31 ENCOUNTER — Encounter: Payer: Self-pay | Admitting: Family Medicine

## 2015-07-31 ENCOUNTER — Ambulatory Visit (INDEPENDENT_AMBULATORY_CARE_PROVIDER_SITE_OTHER): Payer: BC Managed Care – PPO | Admitting: Family Medicine

## 2015-07-31 VITALS — BP 126/80 | HR 75 | Temp 98.8°F | Ht 60.5 in | Wt 155.0 lb

## 2015-07-31 DIAGNOSIS — M25519 Pain in unspecified shoulder: Secondary | ICD-10-CM | POA: Insufficient documentation

## 2015-07-31 DIAGNOSIS — M25512 Pain in left shoulder: Secondary | ICD-10-CM

## 2015-07-31 DIAGNOSIS — R0789 Other chest pain: Secondary | ICD-10-CM | POA: Insufficient documentation

## 2015-07-31 MED ORDER — MELOXICAM 15 MG PO TABS: 15.0000 mg | ORAL_TABLET | Freq: Every day | ORAL | Status: DC

## 2015-07-31 NOTE — Patient Instructions (Signed)
Your exam is re assuring  Xray today of chest and shoulder  Try meloxicam 15 mg daily with food - for inflammation and pain  Use cold and warm compresses on painful area 10 minutes   We will update you with xray report

## 2015-07-31 NOTE — Progress Notes (Signed)
Subjective:    Patient ID: [name], female    DOB: 06/09/1986, 29 y.o.   MRN: [id number]  HPI Here for L breast and arm discomfort   1 week ago - sharp pain - in L breast area  Worse when she bends down  Then the pain started moving up to her shoulder  Now in L arm - all the way down to her fingers - with tingling in fingers   Now more in ant shoulder and arm   Neck feels ok  Had one episode of pain around shoulder blade last week   No nipple discharge  Done breastfeeding for 10 months - occ still gets a drop of milk -both breasts   No leg swelling   Felt sob while walking on Friday - brief -10 minutes  Has walked since then with no problem   No trauma at all   Patient Active Problem List   Diagnosis Date Noted  . Streptococcal sore throat 01/10/2015  . Diarrhea 11/23/2014  . Lower abdominal pain 11/22/2014  . Abdominal pain, epigastric 11/21/2014  . Low back pain 11/21/2014  . Allergic rhinitis 08/16/2014  . Vaccine reaction 08/16/2014  . Cesarean delivery delivered 12/22/2013   Past Medical History  Diagnosis Date  . Pneumonia 1992    pt was in first grade; treated  . GERD (gastroesophageal reflux disease)     just with pregnancy; Tums help   . History of chicken pox   . History of hay fever    Past Surgical History  Procedure Laterality Date  . Wisdom tooth extraction  1999  . Cesarean section  01/23/2012    Procedure: CESAREAN SECTION;  Surgeon: Freddrick March. Tenny Craw, MD;  Location: [facility];  Service: Gynecology;  Laterality: N/A;  . Cesarean section N/A 12/22/2013    Procedure: CESAREAN SECTION;  Surgeon: Freddrick March. Tenny Craw, MD;  Location: [facility];  Service: Obstetrics;  Laterality: N/A;   Social History  Substance Use Topics  . Smoking status: Never Smoker   . Smokeless tobacco: Never Used  . Alcohol Use: 0.0 oz/week    0 Standard drinks or equivalent per week     Comment: occ/rare   Family History  Problem Relation Age of Onset  . Alcohol abuse  Maternal Grandfather   . Hypertension Mother   . Hypertension Father   . Hypertension Other     [employer]  . Stroke Maternal Grandfather   . Stroke Other     [employer]  . Sudden death Maternal Uncle     brain aneurysm  . Diabetes Maternal Grandmother   . Colon cancer Neg Hx   . Colon polyps Neg Hx   . Heart disease Neg Hx   . Esophageal cancer Neg Hx   . Gallbladder disease Neg Hx    Allergies  Allergen Reactions  . Meningococcal Vaccines Shortness Of Breath and Other (See Comments)    Husband did not know reaction - he just knew she was allergic to it   No current outpatient prescriptions on file prior to visit.   No current facility-administered medications on file prior to visit.      Review of Systems Review of Systems  Constitutional: Negative for fever, appetite change, fatigue and unexpected weight change.  Eyes: Negative for pain and visual disturbance.  Respiratory: Negative for cough and shortness of breath.   Cardiovascular: Negative for palpitations   neg for pedal edema  Gastrointestinal: Negative for nausea, diarrhea and constipation.  Genitourinary: Negative for urgency and frequency.  MSK pos for shoulder and arm pain  Skin: Negative for pallor or rash   Neurological: Negative for weakness, light-headedness, numbness and headaches.  Hematological: Negative for adenopathy. Does not bruise/bleed easily.  Psychiatric/Behavioral: Negative for dysphoric mood. The patient is not nervous/anxious.         Objective:   Physical Exam  Constitutional: She appears well-developed and well-nourished. No distress.  obese and well appearing   HENT:  Head: Normocephalic and atraumatic.  Eyes: Conjunctivae and EOM are normal. Pupils are equal, round, and reactive to light. No scleral icterus.  Neck: Normal range of motion. Neck supple. Carotid bruit is not present.  Cardiovascular: Normal rate and regular rhythm.   Pulmonary/Chest: Effort normal and breath  sounds normal. No respiratory distress. She has no wheezes. She has no rales. She exhibits tenderness.  Mild L ant cw tenderness No crepitus or skin change   Musculoskeletal: She exhibits tenderness. She exhibits no edema.  Tender over anterior L shoulder and anterior L cw  Nl rom shoulder-some pain on internal rotation  No swelling or skin change  Nl neck rom and no tenderness  No TS tenderness  No elbow tenderness   No acute neuro changes   Lymphadenopathy:    She has no cervical adenopathy.  Neurological: She is alert. She has normal strength and normal reflexes. She displays no atrophy. No cranial nerve deficit or sensory deficit. She exhibits normal muscle tone. Coordination normal.  L hand - neg tinel test and equivocal Phalen test  Nl grip   Skin: Skin is warm and dry. No rash noted. No erythema. No pallor.  Psychiatric: She has a normal mood and affect.          Assessment & Plan:   Problem List Items Addressed This Visit      Other   Chest wall pain    On L side - with ant shoulder and arm pain  cxr today  Fairly reassuring exam meloxicam 15 mg       Relevant Orders   DG Chest 2 View (Completed)   Pain in joint, shoulder region - Primary    Anterior shoulder pain - rad to chest wall and arm  Fairly good rom  Suspect spasm and poss nerve impingement meloxicam 15 mg px  Ice/heat prn  Xray now       Relevant Orders   DG Shoulder Left (Completed)

## 2015-07-31 NOTE — Assessment & Plan Note (Signed)
On L side - with ant shoulder and arm pain  cxr today  Fairly reassuring exam meloxicam 15 mg

## 2015-07-31 NOTE — Assessment & Plan Note (Signed)
Anterior shoulder pain - rad to chest wall and arm  Fairly good rom  Suspect spasm and poss nerve impingement meloxicam 15 mg px  Ice/heat prn  Xray now

## 2015-07-31 NOTE — Progress Notes (Signed)
Pre visit review using our clinic review tool, if applicable. No additional management support is needed unless otherwise documented below in the visit note. 

## 2015-09-14 ENCOUNTER — Encounter: Payer: Self-pay | Admitting: Family Medicine

## 2015-09-14 ENCOUNTER — Ambulatory Visit (INDEPENDENT_AMBULATORY_CARE_PROVIDER_SITE_OTHER): Payer: BC Managed Care – PPO | Admitting: Family Medicine

## 2015-09-14 VITALS — BP 98/58 | HR 71 | Temp 98.8°F | Wt 155.0 lb

## 2015-09-14 DIAGNOSIS — J069 Acute upper respiratory infection, unspecified: Secondary | ICD-10-CM

## 2015-09-14 MED ORDER — BENZONATATE 200 MG PO CAPS: 200.0000 mg | ORAL_CAPSULE | Freq: Three times a day (TID) | ORAL | Status: DC | PRN

## 2015-09-14 MED ORDER — AMOXICILLIN-POT CLAVULANATE 875-125 MG PO TABS: 1.0000 | ORAL_TABLET | Freq: Two times a day (BID) | ORAL | Status: DC

## 2015-09-14 NOTE — Progress Notes (Signed)
Pre visit review using our clinic review tool, if applicable. No additional management support is needed unless otherwise documented below in the visit note.  H/o similar low BPs prev.  Not lightheaded.   Sx started about 4 weeks ago.  She had muscle aches in her neck, took ibuprofen for that.  Green sputum with cough.  Aching diffusely.  No fevers known but felt like she had one this AM.   Hoarse voice.  No ear pain.  She isn't SOB.  Some facial pain- forehead and maxillary areas B.  No vomiting, no diarrhea.  Teacher.  Mult sick contacts.  Tried some PM nyquil about 10 days ago.   She feels like the cough and sputum are from post nasal gtt.    Meds, vitals, and allergies reviewed.   ROS: See HPI.  Otherwise, noncontributory.  GEN: nad, alert and oriented HEENT: mucous membranes moist, tm w/o erythema, nasal exam w/o erythema, clear discharge noted,  OP with cobblestoning, Sinuses not ttp x4 NECK: supple w/o LA CV: rrr.   PULM: ctab, no inc wob EXT: no edema

## 2015-09-14 NOTE — Patient Instructions (Signed)
Use tessalon for cough.  Start augmentin.  Try nasal saline, gargle with saline.  Try to rest your voice.  Drink plenty of fluids.   Take care.  Try to get some rest.

## 2015-09-15 DIAGNOSIS — J069 Acute upper respiratory infection, unspecified: Secondary | ICD-10-CM | POA: Insufficient documentation

## 2015-09-15 NOTE — Assessment & Plan Note (Signed)
Nontoxic, given the duration would still treat for presumed sinusitis (though not ttp at time of exam).  Start tessalon for cough.  Start augmentin.  Try nasal saline, gargle with saline.  Try to rest voice.  F/u prn.  She agrees .

## 2015-09-26 ENCOUNTER — Telehealth: Payer: Self-pay

## 2015-09-26 NOTE — Telephone Encounter (Signed)
Left message for patient regarding flu shot vaccination.  

## 2016-02-21 ENCOUNTER — Encounter: Payer: Self-pay | Admitting: Primary Care

## 2016-02-21 ENCOUNTER — Ambulatory Visit (INDEPENDENT_AMBULATORY_CARE_PROVIDER_SITE_OTHER): Payer: BC Managed Care – PPO | Admitting: Primary Care

## 2016-02-21 VITALS — BP 110/70 | HR 64 | Temp 98.2°F | Ht 60.0 in | Wt 159.8 lb

## 2016-02-21 DIAGNOSIS — B9689 Other specified bacterial agents as the cause of diseases classified elsewhere: Secondary | ICD-10-CM

## 2016-02-21 DIAGNOSIS — J019 Acute sinusitis, unspecified: Secondary | ICD-10-CM

## 2016-02-21 MED ORDER — AMOXICILLIN-POT CLAVULANATE 875-125 MG PO TABS: 1.0000 | ORAL_TABLET | Freq: Two times a day (BID) | ORAL | Status: DC

## 2016-02-21 NOTE — Patient Instructions (Signed)
Start Augmentin antibiotics. Take 1 tablet by mouth twice daily for 10 days.  Nasal Congestion: Try using Flonase (fluticasone) nasal spray. Instill 2 sprays in each nostril once daily.   You may continue ibuprofen as needed for pain and inflammation.  Ensure you are staying hydrated with water.  It was a pleasure to see you today!  Sinusitis, Adult Sinusitis is redness, soreness, and inflammation of the paranasal sinuses. Paranasal sinuses are air pockets within the bones of your face. They are located beneath your eyes, in the middle of your forehead, and above your eyes. In healthy paranasal sinuses, mucus is able to drain out, and air is able to circulate through them by way of your nose. However, when your paranasal sinuses are inflamed, mucus and air can become trapped. This can allow bacteria and other germs to grow and cause infection. Sinusitis can develop quickly and last only a short time (acute) or continue over a long period (chronic). Sinusitis that lasts for more than 12 weeks is considered chronic. CAUSES Causes of sinusitis include:  Allergies.  Structural abnormalities, such as displacement of the cartilage that separates your nostrils (deviated septum), which can decrease the air flow through your nose and sinuses and affect sinus drainage.  Functional abnormalities, such as when the small hairs (cilia) that line your sinuses and help remove mucus do not work properly or are not present. SIGNS AND SYMPTOMS Symptoms of acute and chronic sinusitis are the same. The primary symptoms are pain and pressure around the affected sinuses. Other symptoms include:  Upper toothache.  Earache.  Headache.  Bad breath.  Decreased sense of smell and taste.  A cough, which worsens when you are lying flat.  Fatigue.  Fever.  Thick drainage from your nose, which often is green and may contain pus (purulent).  Swelling and warmth over the affected sinuses. DIAGNOSIS Your  health care provider will perform a physical exam. During your exam, your health care provider may perform any of the following to help determine if you have acute sinusitis or chronic sinusitis:  Look in your nose for signs of abnormal growths in your nostrils (nasal polyps).  Tap over the affected sinus to check for signs of infection.  View the inside of your sinuses using an imaging device that has a light attached (endoscope). If your health care provider suspects that you have chronic sinusitis, one or more of the following tests may be recommended:  Allergy tests.  Nasal culture. A sample of mucus is taken from your nose, sent to a lab, and screened for bacteria.  Nasal cytology. A sample of mucus is taken from your nose and examined by your health care provider to determine if your sinusitis is related to an allergy. TREATMENT Most cases of acute sinusitis are related to a viral infection and will resolve on their own within 10 days. Sometimes, medicines are prescribed to help relieve symptoms of both acute and chronic sinusitis. These may include pain medicines, decongestants, nasal steroid sprays, or saline sprays. However, for sinusitis related to a bacterial infection, your health care provider will prescribe antibiotic medicines. These are medicines that will help kill the bacteria causing the infection. Rarely, sinusitis is caused by a fungal infection. In these cases, your health care provider will prescribe antifungal medicine. For some cases of chronic sinusitis, surgery is needed. Generally, these are cases in which sinusitis recurs more than 3 times per year, despite other treatments. HOME CARE INSTRUCTIONS  Drink plenty of water. Water  helps thin the mucus so your sinuses can drain more easily.  Use a humidifier.  Inhale steam 3-4 times a day (for example, sit in the bathroom with the shower running).  Apply a warm, moist washcloth to your face 3-4 times a day, or as  directed by your health care provider.  Use saline nasal sprays to help moisten and clean your sinuses.  Take medicines only as directed by your health care provider.  If you were prescribed either an antibiotic or antifungal medicine, finish it all even if you start to feel better. SEEK IMMEDIATE MEDICAL CARE IF:  You have increasing pain or severe headaches.  You have nausea, vomiting, or drowsiness.  You have swelling around your face.  You have vision problems.  You have a stiff neck.  You have difficulty breathing.   This information is not intended to replace advice given to you by your health care provider. Make sure you discuss any questions you have with your health care provider.   Document Released: 10/14/2005 Document Revised: 11/04/2014 Document Reviewed: 10/29/2011 Elsevier Interactive Patient Education [employer].

## 2016-02-21 NOTE — Progress Notes (Signed)
Pre visit review using our clinic review tool, if applicable. No additional management support is needed unless otherwise documented below in the visit note. 

## 2016-02-21 NOTE — Progress Notes (Signed)
Subjective:    Patient ID: [name], female    DOB: 05/28/1986, 30 y.o.   MRN: [id number]  HPI  [name] is a 30 year old female who presents today with a chief complaint of sinus pressure. Her symptoms began 3 weeks ago with cold symptoms. Over the past several weeks she's developed increased sinus pressure, nasal congestion. Over the past 1 day she's noticed swelling and moderate tenderness to her face over her sinus cavities. She's also noticed fatigue, body aches.   She's taken ibuprofen with some improvement. She's blowing green mucous from her nasal cavity. Her daughter was recently diagnosed with acute sinusitis and treated with amoxicillin.   Review of Systems  Constitutional: Positive for fatigue. Negative for fever.  HENT: Positive for congestion and sinus pressure. Negative for ear pain and sore throat.   Respiratory: Positive for cough. Negative for shortness of breath.   Cardiovascular: Negative for chest pain.  Musculoskeletal: Negative for myalgias.       Past Medical History  Diagnosis Date  . Pneumonia 1992    pt was in first grade; treated  . GERD (gastroesophageal reflux disease)     just with pregnancy; Tums help   . History of chicken pox   . History of hay fever      Social History   Social History  . Marital Status: Married    Spouse Name: N/A  . Number of Children: 2  . Years of Education: N/A   Occupational History  . Teacher    Social History Main Topics  . Smoking status: Never Smoker   . Smokeless tobacco: Never Used  . Alcohol Use: 0.0 oz/week    0 Standard drinks or equivalent per week     Comment: occ/rare  . Drug Use: No  . Sexual Activity: Yes   Other Topics Concern  . Not on file   Social History Narrative    Past Surgical History  Procedure Laterality Date  . Wisdom tooth extraction  1999  . Cesarean section  01/23/2012    Procedure: CESAREAN SECTION;  Surgeon: Freddrick MarchKendra H. Tenny Crawoss, MD;  Location: [facility];  Service:  Gynecology;  Laterality: N/A;  . Cesarean section N/A 12/22/2013    Procedure: CESAREAN SECTION;  Surgeon: Freddrick MarchKendra H. Tenny Crawoss, MD;  Location: [facility];  Service: Obstetrics;  Laterality: N/A;    Family History  Problem Relation Age of Onset  . Alcohol abuse Maternal Grandfather   . Hypertension Mother   . Hypertension Father   . Hypertension Other     [employer] Uncle  . Stroke Maternal Grandfather   . Stroke Other     [employer] Uncle  . Sudden death Maternal Uncle     brain aneurysm  . Diabetes Maternal Grandmother   . Colon cancer Neg Hx   . Colon polyps Neg Hx   . Heart disease Neg Hx   . Esophageal cancer Neg Hx   . Gallbladder disease Neg Hx     Allergies  Allergen Reactions  . Meningococcal Vaccines Shortness Of Breath and Other (See Comments)    Husband did not know reaction - he just knew she was allergic to it    No current outpatient prescriptions on file prior to visit.   No current facility-administered medications on file prior to visit.    BP 110/70 mmHg  Pulse 64  Temp(Src) 98.2 F (36.8 C) (Oral)  Ht 5' (1.524 m)  Wt 159 lb 12.8 oz (72.485 kg)  BMI 31.21  kg/m2  SpO2 98%  LMP     Objective:   Physical Exam  Constitutional: She appears well-nourished.  HENT:  Right Ear: Ear canal normal.  Left Ear: Ear canal normal.  Nose: Right sinus exhibits maxillary sinus tenderness and frontal sinus tenderness. Left sinus exhibits maxillary sinus tenderness and frontal sinus tenderness.  Mouth/Throat: Posterior oropharyngeal erythema present. No oropharyngeal exudate or posterior oropharyngeal edema.  Dullness to TM's bilaterally.  Eyes: Conjunctivae are normal.  Neck: Neck supple.  Cardiovascular: Normal rate and regular rhythm.   Pulmonary/Chest: Effort normal and breath sounds normal. She has no wheezes. She has no rales.  Lymphadenopathy:    She has no cervical adenopathy.  Skin: Skin is warm and dry.          Assessment & Plan:  Acute Bacterial  Sinusitis:  Cold symptoms 3 weeks ago, increased sinus pressure x 2 weeks ago. Now with green, thick mucous from nasal cavity. Exam with moderate tenderness to frontal and maxillary sinuses. Clear lungs. Due to duration and presentation, will treat for presumed bacterial involvement. Augmentin BID 10 day supply sent to pharmacy. Start Flonase. Continue ibuprofen PRN. Return precautions provided.

## 2016-03-01 ENCOUNTER — Telehealth: Payer: Self-pay | Admitting: Family Medicine

## 2016-03-01 NOTE — Telephone Encounter (Signed)
PLEASE NOTE: All timestamps contained within this report are represented as [place] Standard Time. CONFIDENTIALTY NOTICE: This fax transmission is intended only for the addressee. It contains information that is legally privileged, confidential or otherwise protected from use or disclosure. If you are not the intended recipient, you are strictly prohibited from reviewing, disclosing, copying using or disseminating any of this information or taking any action in reliance on or regarding this information. If you have received this fax in error, please notify us immediately by telephone so that we can arrange for its return to us. Phone: [phone], Toll-Free: [phone], Fax: [phone] Page: 1 of 1 Call Id: [id number] [facility] Day - Client TELEPHONE ADVICE RECORD [facility] Patient Name: [name] DOB: 03/03/1986 Initial Comment Caller states, may have been exposed to whooping cough today, she may also not have had the vaccine Nurse Assessment Nurse: Debera Latalston, RN, Tinnie GensJeffrey Date/Time Lamount Cohen(Eastern Time): 03/01/2016 2:06:02 PM Confirm and document reason for call. If symptomatic, describe symptoms. You must click the next button to save text entered. ---Caller states, may have been exposed to whooping cough today, she may also not have had the vaccine. Has the patient traveled out of the country within the last 30 days? ---No Does the patient have any new or worsening symptoms? ---Yes Will a triage be completed? ---Yes Related visit to physician within the last 2 weeks? ---No Does the PT have any chronic conditions? (i.e. diabetes, asthma, etc.) ---No Is the patient pregnant or possibly pregnant? (Ask all females between the ages of 7512-55) ---No Is this a behavioral health or substance abuse call? ---No Guidelines Guideline Title Affirmed Question Affirmed Notes Whooping Cough Exposure Whooping cough disease (pertussis), questions  about Final Disposition User [facility] [place], RN, Tinnie GensJeffrey Disagree/Comply: Comply

## 2016-03-01 NOTE — Telephone Encounter (Signed)
I do not see a Tdap vaccine here or [facility] She may have had one with her last pregnancy - she should call her gyn office and ask  If not - I recommend getting one here or at a pharmacy if she can  They are good for 10 years

## 2016-03-01 NOTE — Telephone Encounter (Signed)
Left detailed voicemail with Dr. Royden Purlower's recommendations

## 2016-04-15 ENCOUNTER — Encounter: Payer: Self-pay | Admitting: Internal Medicine

## 2016-04-15 ENCOUNTER — Ambulatory Visit (INDEPENDENT_AMBULATORY_CARE_PROVIDER_SITE_OTHER): Payer: BC Managed Care – PPO | Admitting: Internal Medicine

## 2016-04-15 VITALS — BP 112/72 | HR 76 | Temp 99.0°F | Ht 60.0 in | Wt 160.0 lb

## 2016-04-15 DIAGNOSIS — Z23 Encounter for immunization: Secondary | ICD-10-CM | POA: Diagnosis not present

## 2016-04-15 DIAGNOSIS — Z0000 Encounter for general adult medical examination without abnormal findings: Secondary | ICD-10-CM | POA: Diagnosis not present

## 2016-04-15 DIAGNOSIS — Z111 Encounter for screening for respiratory tuberculosis: Secondary | ICD-10-CM

## 2016-04-15 NOTE — Patient Instructions (Signed)
Health Maintenance, Female Adopting a healthy lifestyle and getting preventive care can go a long way to promote health and wellness. Talk with your health care provider about what schedule of regular examinations is right for you. This is a good chance for you to check in with your provider about disease prevention and staying healthy. In between checkups, there are plenty of things you can do on your own. Experts have done a lot of research about which lifestyle changes and preventive measures are most likely to keep you healthy. Ask your health care provider for more information. WEIGHT AND DIET  Eat a healthy diet  Be sure to include plenty of vegetables, fruits, low-fat dairy products, and lean protein.  Do not eat a lot of foods high in solid fats, added sugars, or salt.  Get regular exercise. This is one of the most important things you can do for your health.  Most adults should exercise for at least 150 minutes each week. The exercise should increase your heart rate and make you sweat (moderate-intensity exercise).  Most adults should also do strengthening exercises at least twice a week. This is in addition to the moderate-intensity exercise.  Maintain a healthy weight  Body mass index (BMI) is a measurement that can be used to identify possible weight problems. It estimates body fat based on height and weight. Your health care provider can help determine your BMI and help you achieve or maintain a healthy weight.  For females 20 years of age and older:   A BMI below 18.5 is considered underweight.  A BMI of 18.5 to 24.9 is normal.  A BMI of 25 to 29.9 is considered overweight.  A BMI of 30 and above is considered obese.  Watch levels of cholesterol and blood lipids  You should start having your blood tested for lipids and cholesterol at 31 years of age, then have this test every 5 years.  You may need to have your cholesterol levels checked more often if:  Your lipid  or cholesterol levels are high.  You are older than 31 years of age.  You are at high risk for heart disease.  CANCER SCREENING   Lung Cancer  Lung cancer screening is recommended for adults 55-80 years old who are at high risk for lung cancer because of a history of smoking.  A yearly low-dose CT scan of the lungs is recommended for people who:  Currently smoke.  Have quit within the past 15 years.  Have at least a 30-pack-year history of smoking. A pack year is smoking an average of one pack of cigarettes a day for 1 year.  Yearly screening should continue until it has been 15 years since you quit.  Yearly screening should stop if you develop a health problem that would prevent you from having lung cancer treatment.  Breast Cancer  Practice breast self-awareness. This means understanding how your breasts normally appear and feel.  It also means doing regular breast self-exams. Let your health care provider know about any changes, no matter how small.  If you are in your 20s or 30s, you should have a clinical breast exam (CBE) by a health care provider every 1-3 years as part of a regular health exam.  If you are 40 or older, have a CBE every year. Also consider having a breast X-ray (mammogram) every year.  If you have a family history of breast cancer, talk to your health care provider about genetic screening.  If you   are at high risk for breast cancer, talk to your health care provider about having an MRI and a mammogram every year.  Breast cancer gene (BRCA) assessment is recommended for women who have family members with BRCA-related cancers. BRCA-related cancers include:  Breast.  Ovarian.  Tubal.  Peritoneal cancers.  Results of the assessment will determine the need for genetic counseling and BRCA1 and BRCA2 testing. Cervical Cancer Your health care provider may recommend that you be screened regularly for cancer of the pelvic organs (ovaries, uterus, and  vagina). This screening involves a pelvic examination, including checking for microscopic changes to the surface of your cervix (Pap test). You may be encouraged to have this screening done every 3 years, beginning at age 21.  For women ages 30-65, health care providers may recommend pelvic exams and Pap testing every 3 years, or they may recommend the Pap and pelvic exam, combined with testing for human papilloma virus (HPV), every 5 years. Some types of HPV increase your risk of cervical cancer. Testing for HPV may also be done on women of any age with unclear Pap test results.  Other health care providers may not recommend any screening for nonpregnant women who are considered low risk for pelvic cancer and who do not have symptoms. Ask your health care provider if a screening pelvic exam is right for you.  If you have had past treatment for cervical cancer or a condition that could lead to cancer, you need Pap tests and screening for cancer for at least 20 years after your treatment. If Pap tests have been discontinued, your risk factors (such as having a new sexual partner) need to be reassessed to determine if screening should resume. Some women have medical problems that increase the chance of getting cervical cancer. In these cases, your health care provider may recommend more frequent screening and Pap tests. Colorectal Cancer  This type of cancer can be detected and often prevented.  Routine colorectal cancer screening usually begins at 31 years of age and continues through 31 years of age.  Your health care provider may recommend screening at an earlier age if you have risk factors for colon cancer.  Your health care provider may also recommend using home test kits to check for hidden blood in the stool.  A small camera at the end of a tube can be used to examine your colon directly (sigmoidoscopy or colonoscopy). This is done to check for the earliest forms of colorectal  cancer.  Routine screening usually begins at age 50.  Direct examination of the colon should be repeated every 5-10 years through 31 years of age. However, you may need to be screened more often if early forms of precancerous polyps or small growths are found. Skin Cancer  Check your skin from head to toe regularly.  Tell your health care provider about any new moles or changes in moles, especially if there is a change in a mole's shape or color.  Also tell your health care provider if you have a mole that is larger than the size of a pencil eraser.  Always use sunscreen. Apply sunscreen liberally and repeatedly throughout the day.  Protect yourself by wearing long sleeves, pants, a wide-brimmed hat, and sunglasses whenever you are outside. HEART DISEASE, DIABETES, AND HIGH BLOOD PRESSURE   High blood pressure causes heart disease and increases the risk of stroke. High blood pressure is more likely to develop in:  People who have blood pressure in the high end   of the normal range (130-139/85-89 mm Hg).  People who are overweight or obese.  People who are African American.  If you are 38-23 years of age, have your blood pressure checked every 3-5 years. If you are 61 years of age or older, have your blood pressure checked every year. You should have your blood pressure measured twice--once when you are at a hospital or clinic, and once when you are not at a hospital or clinic. Record the average of the two measurements. To check your blood pressure when you are not at a hospital or clinic, you can use:  An automated blood pressure machine at a pharmacy.  A home blood pressure monitor.  If you are between 45 years and 39 years old, ask your health care provider if you should take aspirin to prevent strokes.  Have regular diabetes screenings. This involves taking a blood sample to check your fasting blood sugar level.  If you are at a normal weight and have a low risk for diabetes,  have this test once every three years after 31 years of age.  If you are overweight and have a high risk for diabetes, consider being tested at a younger age or more often. PREVENTING INFECTION  Hepatitis B  If you have a higher risk for hepatitis B, you should be screened for this virus. You are considered at high risk for hepatitis B if:  You were born in a country where hepatitis B is common. Ask your health care provider which countries are considered high risk.  Your parents were born in a high-risk country, and you have not been immunized against hepatitis B (hepatitis B vaccine).  You have HIV or AIDS.  You use needles to inject street drugs.  You live with someone who has hepatitis B.  You have had sex with someone who has hepatitis B.  You get hemodialysis treatment.  You take certain medicines for conditions, including cancer, organ transplantation, and autoimmune conditions. Hepatitis C  Blood testing is recommended for:  Everyone born from [birth year] through 1966.  Anyone with known risk factors for hepatitis C. Sexually transmitted infections (STIs)  You should be screened for sexually transmitted infections (STIs) including gonorrhea and chlamydia if:  You are sexually active and are younger than 31 years of age.  You are older than 31 years of age and your health care provider tells you that you are at risk for this type of infection.  Your sexual activity has changed since you were last screened and you are at an increased risk for chlamydia or gonorrhea. Ask your health care provider if you are at risk.  If you do not have HIV, but are at risk, it may be recommended that you take a prescription medicine daily to prevent HIV infection. This is called pre-exposure prophylaxis (PrEP). You are considered at risk if:  You are sexually active and do not regularly use condoms or know the HIV status of your partner(s).  You take drugs by injection.  You are sexually  active with a partner who has HIV. Talk with your health care provider about whether you are at high risk of being infected with HIV. If you choose to begin PrEP, you should first be tested for HIV. You should then be tested every 3 months for as long as you are taking PrEP.  PREGNANCY   If you are premenopausal and you may become pregnant, ask your health care provider about preconception counseling.  If you may  become pregnant, take 400 to 800 micrograms (mcg) of folic acid every day.  If you want to prevent pregnancy, talk to your health care provider about birth control (contraception). OSTEOPOROSIS AND MENOPAUSE   Osteoporosis is a disease in which the bones lose minerals and strength with aging. This can result in serious bone fractures. Your risk for osteoporosis can be identified using a bone density scan.  If you are 61 years of age or older, or if you are at risk for osteoporosis and fractures, ask your health care provider if you should be screened.  Ask your health care provider whether you should take a calcium or vitamin D supplement to lower your risk for osteoporosis.  Menopause may have certain physical symptoms and risks.  Hormone replacement therapy may reduce some of these symptoms and risks. Talk to your health care provider about whether hormone replacement therapy is right for you.  HOME CARE INSTRUCTIONS   Schedule regular health, dental, and eye exams.  Stay current with your immunizations.   Do not use any tobacco products including cigarettes, chewing tobacco, or electronic cigarettes.  If you are pregnant, do not drink alcohol.  If you are breastfeeding, limit how much and how often you drink alcohol.  Limit alcohol intake to no more than 1 drink per day for nonpregnant women. One drink equals 12 ounces of beer, 5 ounces of wine, or 1 ounces of hard liquor.  Do not use street drugs.  Do not share needles.  Ask your health care provider for help if  you need support or information about quitting drugs.  Tell your health care provider if you often feel depressed.  Tell your health care provider if you have ever been abused or do not feel safe at home.   This information is not intended to replace advice given to you by your health care provider. Make sure you discuss any questions you have with your health care provider.   Document Released: 04/29/2011 Document Revised: 11/04/2014 Document Reviewed: 09/15/2013 Elsevier Interactive Patient Education [employer].

## 2016-04-15 NOTE — Progress Notes (Addendum)
Subjective:    Patient ID: Breanna HashimotoKristen E Turner, female    DOB: 10/07/1986, 30 y.o.   MRN: [id number]  HPI  Pt presents to the clinic today for her annual exam. 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  Flu: never Tetanus: unsure if within 10 years Pap Smear: 10/2014 Dentist: as needed  Diet: She does eat meat. She consumes fruits and veggies daily. She occasionally eats fried foods. She drinks mostly water. Exercise: None  Review of Systems      Past Medical History  Diagnosis Date  . Pneumonia 1992    pt was in first grade; treated  . GERD (gastroesophageal reflux disease)     just with pregnancy; Tums help   . History of chicken pox   . History of hay fever     Current Outpatient Prescriptions  Medication Sig Dispense Refill  . Prenatal Vit-Fe Sulfate-FA (PRENATAL MULTIVIT-IRON PO) Take 1 tablet by mouth daily.     No current facility-administered medications for this visit.    Allergies  Allergen Reactions  . Meningococcal Vaccines Shortness Of Breath and Other (See Comments)    Husband did not know reaction - he just knew she was allergic to it    Family History  Problem Relation Age of Onset  . Alcohol abuse Maternal Grandfather   . Hypertension Mother   . Hypertension Father   . Hypertension Other     [employer] Uncle  . Stroke Maternal Grandfather   . Stroke Other     [employer] Uncle  . Sudden death Maternal Uncle     brain aneurysm  . Diabetes Maternal Grandmother   . Colon cancer Neg Hx   . Colon polyps Neg Hx   . Heart disease Neg Hx   . Esophageal cancer Neg Hx   . Gallbladder disease Neg Hx     Social History   Social History  . Marital Status: Married    Spouse Name: N/A  . Number of Children: 2  . Years of Education: N/A   Occupational History  . Teacher    Social History Main Topics  . Smoking status: Never Smoker   . Smokeless tobacco: Never Used  . Alcohol Use: 0.0 oz/week    0 Standard drinks or equivalent  per week     Comment: occasional  . Drug Use: No  . Sexual Activity: Yes   Other Topics Concern  . Not on file   Social History Narrative     Constitutional: Denies fever, malaise, fatigue, headache or abrupt weight changes.  HEENT: Denies eye pain, eye redness, ear pain, ringing in the ears, wax buildup, runny nose, nasal congestion, bloody nose, or sore throat. Respiratory: Denies difficulty breathing, shortness of breath, cough or sputum production.   Cardiovascular: Denies chest pain, chest tightness, palpitations or swelling in the hands or feet.  Gastrointestinal: Denies abdominal pain, bloating, constipation, diarrhea or blood in the stool.  GU: Denies urgency, frequency, pain with urination, burning sensation, blood in urine, odor or discharge. Musculoskeletal: Denies decrease in range of motion, difficulty with gait, muscle pain or joint pain and swelling.  Skin: Denies redness, rashes, lesions or ulcercations.  Neurological: Pt reports tingling in her left arm. Denies dizziness, difficulty with memory, difficulty with speech or problems with balance and coordination.  Psych: Denies anxiety, depression, SI/HI.  No other specific complaints in a complete review of systems (except as listed in HPI above).  Objective:   Physical  Exam  BP 112/72 mmHg  Pulse 76  Temp(Src) 99 F (37.2 C) (Oral)  Ht 5' (1.524 m)  Wt 160 lb (72.576 kg)  BMI 31.25 kg/m2  SpO2 98%  LMP 03/19/2016 Wt Readings from Last 3 Encounters:  04/15/16 160 lb (72.576 kg)  02/21/16 159 lb 12.8 oz (72.485 kg)  09/14/15 155 lb (70.308 kg)    General: Appears her stated age, obese in NAD. Skin: Warm, dry and intact. No rashes, lesions or ulcerations noted. HEENT: Head: normal shape and size; Eyes: sclera white, no icterus, conjunctiva pink, PERRLA and EOMs intact; Ears: Tm's gray and intact, normal light reflex; Throat/Mouth: Teeth present, mucosa pink and moist, no exudate, lesions or ulcerations noted.   Neck:  Neck supple, trachea midline. No masses, lumps or thyromegaly present.  Cardiovascular: Normal rate and rhythm. S1,S2 noted.  No murmur, rubs or gallops noted. No JVD or BLE edema.  Pulmonary/Chest: Normal effort and positive vesicular breath sounds. No respiratory distress. No wheezes, rales or ronchi noted.  Abdomen: Soft and nontender. Normal bowel sounds. No distention or masses noted. Liver, spleen and kidneys non palpable. Musculoskeletal: Strength 5/5 BUE/BLE. No signs of joint swelling. No difficulty with gait.  Neurological: Alert and oriented. Cranial nerves II-XII grossly intact. Coordination normal.  Psychiatric: Mood and affect normal. Behavior is normal. Judgment and thought content normal.     BMET    Component Value Date/Time   NA 139 11/28/2014 0914   NA 141 11/17/2014 2134   K 4.1 11/28/2014 0914   K 3.9 11/17/2014 2134   CL 105 11/28/2014 0914   CL 105 11/17/2014 2134   CO2 29 11/28/2014 0914   CO2 29 11/17/2014 2134   GLUCOSE 83 11/28/2014 0914   GLUCOSE 98 11/17/2014 2134   BUN 15 11/28/2014 0914   BUN 12 11/17/2014 2134   CREATININE 0.74 11/28/2014 0914   CREATININE 0.73 11/17/2014 2134   CALCIUM 9.2 11/28/2014 0914   CALCIUM 9.2 11/17/2014 2134   GFRNONAA >60 11/17/2014 2134   GFRAA >60 11/17/2014 2134    Lipid Panel  No results found for: CHOL, TRIG, HDL, CHOLHDL, VLDL, LDLCALC  CBC    Component Value Date/Time   WBC 8.8 11/21/2014 1257   WBC 11.1* 11/17/2014 2134   RBC 4.81 11/21/2014 1257   RBC 4.72 11/17/2014 2134   HGB 14.5 11/21/2014 1257   HGB 14.1 11/17/2014 2134   HCT 42.0 11/21/2014 1257   HCT 42.7 11/17/2014 2134   PLT 211.0 11/21/2014 1257   PLT 201 11/17/2014 2134   MCV 87.3 11/21/2014 1257   MCV 91 11/17/2014 2134   MCH 29.9 11/17/2014 2134   MCH 31.4 12/23/2013 0621   MCHC 34.5 11/21/2014 1257   MCHC 33.0 11/17/2014 2134   RDW 12.9 11/21/2014 1257   RDW 13.0 11/17/2014 2134   LYMPHSABS 2.2 11/21/2014 1257    LYMPHSABS 3.1 11/17/2014 2134   MONOABS 0.5 11/21/2014 1257   MONOABS 0.7 11/17/2014 2134   EOSABS 0.2 11/21/2014 1257   EOSABS 0.2 11/17/2014 2134   BASOSABS 0.0 11/21/2014 1257   BASOSABS 0.1 11/17/2014 2134    Hgb A1C No results found for: HGBA1C       Assessment & Plan:   Preventative Health Maintenance:  Encouraged her to get a flu shot in the fall Td today Encouraged her to consume a balanced diet and start an exercise regimen Pap Smear UTD Encouraged her to see a dentist annually Will check CBC, CMET, Lipid and A1C today TB  test today  RTC in 1 year, sooner if needed Nicki Reaper, NP

## 2016-04-15 NOTE — Addendum Note (Signed)
Addended by: Alvina ChouWALSH, Sativa Gelles J on: 04/15/2016 04:19 PM   Modules accepted: Kipp BroodSmartSet

## 2016-04-15 NOTE — Progress Notes (Signed)
Pre visit review using our clinic review tool, if applicable. No additional management support is needed unless otherwise documented below in the visit note. 

## 2016-04-16 LAB — CBC
HEMATOCRIT: 42.2 % (ref 36.0–46.0)
HEMOGLOBIN: 14.3 g/dL (ref 12.0–15.0)
MCHC: 33.8 g/dL (ref 30.0–36.0)
MCV: 88.5 fl (ref 78.0–100.0)
PLATELETS: 221 10*3/uL (ref 150.0–400.0)
RBC: 4.76 Mil/uL (ref 3.87–5.11)
RDW: 13.5 % (ref 11.5–15.5)
WBC: 9.3 10*3/uL (ref 4.0–10.5)

## 2016-04-16 LAB — COMPREHENSIVE METABOLIC PANEL
ALK PHOS: 53 U/L (ref 39–117)
ALT: 8 U/L (ref 0–35)
AST: 13 U/L (ref 0–37)
Albumin: 4.2 g/dL (ref 3.5–5.2)
BILIRUBIN TOTAL: 0.3 mg/dL (ref 0.2–1.2)
BUN: 21 mg/dL (ref 6–23)
CALCIUM: 9.8 mg/dL (ref 8.4–10.5)
CO2: 30 mEq/L (ref 19–32)
Chloride: 104 mEq/L (ref 96–112)
Creatinine, Ser: 0.85 mg/dL (ref 0.40–1.20)
GFR: 83.16 mL/min (ref >60.00)
GLUCOSE: 77 mg/dL (ref 70–99)
POTASSIUM: 4.2 meq/L (ref 3.5–5.1)
Sodium: 138 mEq/L (ref 135–145)
TOTAL PROTEIN: 7.2 g/dL (ref 6.0–8.3)

## 2016-04-16 LAB — LIPID PANEL
CHOLESTEROL: 153 mg/dL (ref 0–200)
HDL: 43.1 mg/dL (ref >39.00)
NonHDL: 109.5
TRIGLYCERIDES: 209 mg/dL — AB (ref 0.0–149.0)
Total CHOL/HDL Ratio: 4
VLDL: 41.8 mg/dL — AB (ref 0.0–40.0)

## 2016-04-16 LAB — LDL CHOLESTEROL, DIRECT: Direct LDL: 94 mg/dL

## 2016-04-16 LAB — HEMOGLOBIN A1C: HEMOGLOBIN A1C: 4.9 % (ref 4.6–6.5)

## 2016-04-16 NOTE — Addendum Note (Signed)
Addended by: Roena MaladyEVONTENNO, Lavita Pontius Y on: 04/16/2016 11:55 AM   Modules accepted: Orders, SmartSet

## 2016-04-18 ENCOUNTER — Encounter: Payer: Self-pay | Admitting: *Deleted

## 2016-04-18 LAB — TB SKIN TEST
Induration: 0 mm
TB Skin Test: NEGATIVE

## 2016-04-22 NOTE — Addendum Note (Signed)
Addended by: Lorre MunroeBAITY, Vaani Morren W on: 04/22/2016 03:42 PM   Modules accepted: Kipp BroodSmartSet

## 2016-09-11 ENCOUNTER — Telehealth: Payer: Self-pay | Admitting: Family Medicine

## 2016-09-11 NOTE — Telephone Encounter (Signed)
Pt has appt on 09/12/16 at 3:30 with Harlin Heys Gessner NP.

## 2016-09-11 NOTE — Telephone Encounter (Signed)
Patient Name: Royetta CrochetKRISTEN Monnig  DOB: 03/07/1986    Initial Comment Caller states had a fever 99.5 - 100.4, body aches, throat is red one side is swollen and is not sure is she has strep.    Nurse Assessment  Nurse: Renaldo FiddlerAdkins, RN, Raynelle FanningJulie Date/Time Lamount Cohen(Eastern Time): 09/11/2016 4:03:49 PM  Confirm and document reason for call. If symptomatic, describe symptoms. You must click the next button to save text entered. ---Caller states she has had a fever 99.5 - 100.4, body aches, and sore throat for the last 3 days. The back of her throat is swollen throat on 1 side. She does see white patches and has a hx of tonsil stones( calcium deposits ) and she "picked one out on Sunday. Her current temp is 100.3, temporal.  Has the patient traveled out of the country within the last 30 days? ---Not Applicable  Does the patient have any new or worsening symptoms? ---Yes  Will a triage be completed? ---Yes  Related visit to physician within the last 2 weeks? ---No  Does the PT have any chronic conditions? (i.e. diabetes, asthma, etc.) ---Yes  List chronic conditions. ---Tonsil stones  Is the patient pregnant or possibly pregnant? (Ask all females between the ages of 10512-55) ---No  Is this a behavioral health or substance abuse call? ---No     Guidelines    Guideline Title Affirmed Question Affirmed Notes  Sore Throat Fever present > 3 days (72 hours)    Final Disposition User   See Physician within 24 Hours Renaldo FiddlerAdkins, RN, Raynelle FanningJulie    Referrals  REFERRED TO PCP OFFICE   Disagree/Comply: Danella Maiersomply

## 2016-09-12 ENCOUNTER — Ambulatory Visit (INDEPENDENT_AMBULATORY_CARE_PROVIDER_SITE_OTHER): Payer: BC Managed Care – PPO | Admitting: Family Medicine

## 2016-09-12 ENCOUNTER — Encounter: Payer: Self-pay | Admitting: Family Medicine

## 2016-09-12 VITALS — BP 128/82 | HR 82 | Temp 99.0°F | Wt 159.8 lb

## 2016-09-12 DIAGNOSIS — R509 Fever, unspecified: Secondary | ICD-10-CM

## 2016-09-12 DIAGNOSIS — J029 Acute pharyngitis, unspecified: Secondary | ICD-10-CM

## 2016-09-12 LAB — POCT RAPID STREP A (OFFICE): RAPID STREP A SCREEN: NEGATIVE

## 2016-09-12 MED ORDER — AMOXICILLIN 500 MG PO CAPS: 500.0000 mg | ORAL_CAPSULE | Freq: Three times a day (TID) | ORAL | 0 refills | Status: DC

## 2016-09-12 NOTE — Progress Notes (Signed)
Subjective:    Patient ID: [name], female    DOB: 12/31/1985, 30 y.o.   MRN: [id number]  HPI This is a 30 yo female who presents today with sore throat, fever and body aches x 4 days. Feels a little different from typical strep throat. Symptoms came on suddenly. Joint and muscle pain. No cough. 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 Fever 100.5.  Did remove a tonsil stone several days ago and feels that she may have scratched her tonsil with her fingernail. Has noticed white patches on right side of throat. Has been taking tylenol with some relief. Avoiding ibuprofen because she is trying to get pregnant.   Past Medical History:  Diagnosis Date  . GERD (gastroesophageal reflux disease)    just with pregnancy; Tums help   . History of chicken pox   . History of hay fever   . Pneumonia 1992   pt was in first grade; treated   Past Surgical History:  Procedure Laterality Date  . CESAREAN SECTION  01/23/2012   Procedure: CESAREAN SECTION;  Surgeon: Freddrick MarchKendra H. Tenny Crawoss, MD;  Location: [facility];  Service: Gynecology;  Laterality: N/A;  . CESAREAN SECTION N/A 12/22/2013   Procedure: CESAREAN SECTION;  Surgeon: Freddrick MarchKendra H. Tenny Crawoss, MD;  Location: [facility];  Service: Obstetrics;  Laterality: N/A;  . WISDOM TOOTH EXTRACTION  1999   Family History  Problem Relation Age of Onset  . Alcohol abuse Maternal Grandfather   . Hypertension Mother   . Hypertension Father   . Hypertension Other     [employer] Uncle  . Stroke Maternal Grandfather   . Stroke Other     [employer] Uncle  . Sudden death Maternal Uncle     brain aneurysm  . Diabetes Maternal Grandmother   . Colon cancer Neg Hx   . Colon polyps Neg Hx   . Heart disease Neg Hx   . Esophageal cancer Neg Hx   . Gallbladder disease Neg Hx    Social History  Substance Use Topics  . Smoking status: Never Smoker  . Smokeless tobacco: Never Used  . Alcohol use 0.0 oz/week     Comment: occasional      Review of Systems Per HPI      Objective:   Physical Exam  Constitutional: She appears well-developed and well-nourished. No distress.  HENT:  Head: Normocephalic and atraumatic.  Right Ear: Tympanic membrane, external ear and ear canal normal.  Left Ear: External ear normal.  Nose: Nose normal.  Mouth/Throat:    Skin: She is not diaphoretic.  Vitals reviewed.     BP 128/82 (BP Location: Right Arm, Patient Position: Sitting, Cuff Size: Normal)   Pulse 82   Temp 99 F (37.2 C) (Oral)   Wt 159 lb 12 oz (72.5 kg)   LMP 08/24/2016   SpO2 98%   BMI 31.20 kg/m  Wt Readings from Last 3 Encounters:  09/12/16 159 lb 12 oz (72.5 kg)  04/15/16 160 lb (72.6 kg)  02/21/16 159 lb 12.8 oz (72.5 kg)   Results for orders placed or performed in visit on 09/12/16  POCT rapid strep A  Result Value Ref Range   Rapid Strep A Screen Negative Negative       Assessment & Plan:  1. Sore throat - POCT rapid strep A  2. Fever, unspecified fever cause - POCT rapid strep A - continue acetaminophen prn  3. Exudative pharyngitis - rapid strep was negative but will cover  for bacterial due to abnormal exam and recent manipulation of tonsil by patient.  - amoxicillin (AMOXIL) 500 MG capsule; Take 1 capsule (500 mg total) by mouth 3 (three) times daily.  Dispense: 20 capsule; Refill: 0   Breanna Reeeborah Haji Delaine, FNP-BC  [facility], [place], [facility]  09/14/2016 8:26 AM

## 2016-09-12 NOTE — Patient Instructions (Signed)
Change sheets, towels and toothbrushes after 24 hours of antibiotic   Strep Throat Strep throat is a bacterial infection of the throat. Your health care provider may call the infection tonsillitis or pharyngitis, depending on whether there is swelling in the tonsils or at the back of the throat. Strep throat is most common during the cold months of the year in children who are 55-31 years of age, but it can happen during any season in people of any age. This infection is spread from person to person (contagious) through coughing, sneezing, or close contact. What are the causes? Strep throat is caused by the bacteria called Streptococcus pyogenes. What increases the risk? This condition is more likely to develop in:  People who spend time in crowded places where the infection can spread easily.  People who have close contact with someone who has strep throat. What are the signs or symptoms? Symptoms of this condition include:  Fever or chills.  Redness, swelling, or pain in the tonsils or throat.  Pain or difficulty when swallowing.  White or yellow spots on the tonsils or throat.  Swollen, tender glands in the neck or under the jaw.  Red rash all over the body (rare). How is this diagnosed? This condition is diagnosed by performing a rapid strep test or by taking a swab of your throat (throat culture test). Results from a rapid strep test are usually ready in a few minutes, but throat culture test results are available after one or two days. How is this treated? This condition is treated with antibiotic medicine. Follow these instructions at home: Medicines  Take over-the-counter and prescription medicines only as told by your health care provider.  Take your antibiotic as told by your health care provider. Do not stop taking the antibiotic even if you start to feel better.  Have family members who also have a sore throat or fever tested for strep throat. They may need  antibiotics if they have the strep infection. Eating and drinking  Do not share food, drinking cups, or personal items that could cause the infection to spread to other people.  If swallowing is difficult, try eating soft foods until your sore throat feels better.  Drink enough fluid to keep your urine clear or pale yellow. General instructions  Gargle with a salt-water mixture 3-4 times per day or as needed. To make a salt-water mixture, completely dissolve -1 tsp of salt in 1 cup of warm water.  Make sure that all household members wash their hands well.  Get plenty of rest.  Stay home from school or work until you have been taking antibiotics for 24 hours.  Keep all follow-up visits as told by your health care provider. This is important. Contact a health care provider if:  The glands in your neck continue to get bigger.  You develop a rash, cough, or earache.  You cough up a thick liquid that is green, yellow-brown, or bloody.  You have pain or discomfort that does not get better with medicine.  Your problems seem to be getting worse rather than better.  You have a fever. Get help right away if:  You have new symptoms, such as vomiting, severe headache, stiff or painful neck, chest pain, or shortness of breath.  You have severe throat pain, drooling, or changes in your voice.  You have swelling of the neck, or the skin on the neck becomes red and tender.  You have signs of dehydration, such as fatigue, dry  mouth, and decreased urination.  You become increasingly sleepy, or you cannot wake up completely.  Your joints become red or painful. This information is not intended to replace advice given to you by your health care provider. Make sure you discuss any questions you have with your health care provider. Document Released: 10/11/2000 Document Revised: 06/12/2016 Document Reviewed: 02/06/2015 Elsevier Interactive Patient Education  2017 [employer].

## 2016-09-12 NOTE — Progress Notes (Signed)
Pre visit review using our clinic review tool, if applicable. No additional management support is needed unless otherwise documented below in the visit note. 

## 2016-10-18 ENCOUNTER — Telehealth: Payer: Self-pay

## 2016-10-18 DIAGNOSIS — J029 Acute pharyngitis, unspecified: Secondary | ICD-10-CM

## 2016-10-18 MED ORDER — AMOXICILLIN 500 MG PO CAPS: 500.0000 mg | ORAL_CAPSULE | Freq: Three times a day (TID) | ORAL | 0 refills | Status: DC

## 2016-10-18 NOTE — Telephone Encounter (Signed)
Pt said her children were diagnosed today with strep throat and started on amoxicillin and pt was last seen 09/12/16 with strep. Pt and her family is going out of town this afternoon thru the weekend. Pt request abx to [facility] in case gets strep while out of town.Presently pt does not have symptoms. Pt request cb.

## 2016-10-18 NOTE — Telephone Encounter (Signed)
Pt notified Rx sent and advise of Dr. Tower's comments  

## 2016-10-18 NOTE — Telephone Encounter (Signed)
That is fine Don't use unless you get symptoms Keep us posted   Sent electronically

## 2016-11-25 ENCOUNTER — Encounter: Payer: Self-pay | Admitting: Family Medicine

## 2016-11-25 ENCOUNTER — Ambulatory Visit (INDEPENDENT_AMBULATORY_CARE_PROVIDER_SITE_OTHER): Payer: BC Managed Care – PPO | Admitting: Family Medicine

## 2016-11-25 VITALS — BP 108/62 | HR 97 | Temp 102.0°F | Ht 60.0 in | Wt 159.5 lb

## 2016-11-25 DIAGNOSIS — J111 Influenza due to unidentified influenza virus with other respiratory manifestations: Secondary | ICD-10-CM | POA: Diagnosis not present

## 2016-11-25 MED ORDER — OSELTAMIVIR PHOSPHATE 75 MG PO CAPS: 75.0000 mg | ORAL_CAPSULE | Freq: Two times a day (BID) | ORAL | 0 refills | Status: DC

## 2016-11-25 NOTE — Patient Instructions (Addendum)
You have influenza  Drink fluids and rest  Take the tamiflu as directed  Tylenol every 4 hours as directed and ibuprofen with food every 6 hours as directed will help control the fever  For congestion and cough- mucinex DM over the counter as directed Wash hands/surfaces and wear a mask around others  You need to stay out of work until no fever for 2 days    Influenza, Adult Influenza, more commonly known as "the flu," is a viral infection that primarily affects the respiratory tract. The respiratory tract includes organs that help you breathe, such as the lungs, nose, and throat. The flu causes many common cold symptoms, as well as a high fever and body aches. The flu spreads easily from person to person (is contagious). Getting a flu shot (influenza vaccination) every year is the best way to prevent influenza. What are the causes? Influenza is caused by a virus. You can catch the virus by:  Breathing in droplets from an infected person's cough or sneeze.  Touching something that was recently contaminated with the virus and then touching your mouth, nose, or eyes. What increases the risk? The following factors may make you more likely to get the flu:  Not cleaning your hands frequently with soap and water or alcohol-based hand sanitizer.  Having close contact with many people during cold and flu season.  Touching your mouth, eyes, or nose without washing or sanitizing your hands first.  Not drinking enough fluids or not eating a healthy diet.  Not getting enough sleep or exercise.  Being under a high amount of stress.  Not getting a yearly (annual) flu shot. You may be at a higher risk of complications from the flu, such as a severe lung infection (pneumonia), if you:  Are over the age of 31.  Are pregnant.  Have a weakened disease-fighting system (immune system). You may have a weakened immune system if you:  Have HIV or AIDS.  Are undergoing  chemotherapy.  Aretaking medicines that reduce the activity of (suppress) the immune system.  Have a long-term (chronic) illness, such as heart disease, kidney disease, diabetes, or lung disease.  Have a liver disorder.  Are obese.  Have anemia. What are the signs or symptoms? Symptoms of this condition typically last 4-10 days and may include:  Fever.  Chills.  Headache, body aches, or muscle aches.  Sore throat.  Cough.  Runny or congested nose.  Chest discomfort and cough.  Poor appetite.  Weakness or tiredness (fatigue).  Dizziness.  Nausea or vomiting. How is this diagnosed? This condition may be diagnosed based on your medical history and a physical exam. Your health care provider may do a nose or throat swab test to confirm the diagnosis. How is this treated? If influenza is detected early, you can be treated with antiviral medicine that can reduce the length of your illness and the severity of your symptoms. This medicine may be given by mouth (orally) or through an IV tube that is inserted in one of your veins. The goal of treatment is to relieve symptoms by taking care of yourself at home. This may include taking over-the-counter medicines, drinking plenty of fluids, and adding humidity to the air in your home. In some cases, influenza goes away on its own. Severe influenza or complications from influenza may be treated in a hospital. Follow these instructions at home:  Take over-the-counter and prescription medicines only as told by your health care provider.  Use a cool  mist humidifier to add humidity to the air in your home. This can make breathing easier.  Rest as needed.  Drink enough fluid to keep your urine clear or pale yellow.  Cover your mouth and nose when you cough or sneeze.  Wash your hands with soap and water often, especially after you cough or sneeze. If soap and water are not available, use hand sanitizer.  Stay home from work or  school as told by your health care provider. Unless you are visiting your health care provider, try to avoid leaving home until your fever has been gone for 24 hours without the use of medicine.  Keep all follow-up visits as told by your health care provider. This is important. How is this prevented?  Getting an annual flu shot is the best way to avoid getting the flu. You may get the flu shot in late summer, fall, or winter. Ask your health care provider when you should get your flu shot.  Wash your hands often or use hand sanitizer often.  Avoid contact with people who are sick during cold and flu season.  Eat a healthy diet, drink plenty of fluids, get enough sleep, and exercise regularly. Contact a health care provider if:  You develop new symptoms.  You have:  Chest pain.  Diarrhea.  A fever.  Your cough gets worse.  You produce more mucus.  You feel nauseous or you vomit. Get help right away if:  You develop shortness of breath or difficulty breathing.  Your skin or nails turn a bluish color.  You have severe pain or stiffness in your neck.  You develop a sudden headache or sudden pain in your face or ear.  You cannot stop vomiting. This information is not intended to replace advice given to you by your health care provider. Make sure you discuss any questions you have with your health care provider. Document Released: 10/11/2000 Document Revised: 03/21/2016 Document Reviewed: 08/08/2015 Elsevier Interactive Patient Education  2017 [employer]

## 2016-11-25 NOTE — Assessment & Plan Note (Signed)
Fever started last night  Will px tamiflu 75 mg bid for 5 days Disc transmission- will wipe surfaces/wear a mask and no work until 2 days of no fever Disc symptomatic care - see instructions on AVS  Update if not starting to improve in a week or if worsening

## 2016-11-25 NOTE — Progress Notes (Signed)
Subjective:    Patient ID: [name], female    DOB: 25-Mar-1986, 31 y.o.   MRN: [id number]  HPI Here for flu like symptoms /uri  Temp: (!) 102 F (38.9 C)    Cough-not very productive -? If color to it  Congestion - clear nasal discharge with sinus pressure (maxillary area) Scratchy throat    started Friday with general malaise and fatigue   Last night started to run a fever - body aches all night   otc- tylenol only   Patient Active Problem List   Diagnosis Date Noted  . Influenza with respiratory manifestation 11/25/2016  . Pain in joint, shoulder region 07/31/2015  . Chest wall pain 07/31/2015  . Allergic rhinitis 08/16/2014  . Vaccine reaction 08/16/2014  . Cesarean delivery delivered 12/22/2013   Past Medical History:  Diagnosis Date  . GERD (gastroesophageal reflux disease)    just with pregnancy; Tums help   . History of chicken pox   . History of hay fever   . Pneumonia 1992   pt was in first grade; treated   Past Surgical History:  Procedure Laterality Date  . CESAREAN SECTION  01/23/2012   Procedure: CESAREAN SECTION;  Surgeon: Freddrick March. Tenny Craw, MD;  Location: [facility];  Service: Gynecology;  Laterality: N/A;  . CESAREAN SECTION N/A 12/22/2013   Procedure: CESAREAN SECTION;  Surgeon: Freddrick March. Tenny Craw, MD;  Location: [facility];  Service: Obstetrics;  Laterality: N/A;  . WISDOM TOOTH EXTRACTION  1999   Social History  Substance Use Topics  . Smoking status: Never Smoker  . Smokeless tobacco: Never Used  . Alcohol use 0.0 oz/week     Comment: occasional   Family History  Problem Relation Age of Onset  . Alcohol abuse Maternal Grandfather   . Hypertension Mother   . Hypertension Father   . Hypertension Other     [employer]  . Stroke Maternal Grandfather   . Stroke Other     [employer]  . Sudden death Maternal Uncle     brain aneurysm  . Diabetes Maternal Grandmother   . Colon cancer Neg Hx   . Colon polyps Neg Hx   . Heart disease Neg Hx   .  Esophageal cancer Neg Hx   . Gallbladder disease Neg Hx    Allergies  Allergen Reactions  . Meningococcal Vaccines Shortness Of Breath and Other (See Comments)    Husband did not know reaction - he just knew she was allergic to it   Current Outpatient Prescriptions on File Prior to Visit  Medication Sig Dispense Refill  . Prenatal Vit-Fe Sulfate-FA (PRENATAL MULTIVIT-IRON PO) Take 1 tablet by mouth daily.     No current facility-administered medications on file prior to visit.     Review of Systems  Constitutional: Positive for appetite change, chills, fatigue and fever.  HENT: Positive for congestion, postnasal drip, rhinorrhea, sinus pressure, sneezing and sore throat. Negative for ear pain.   Eyes: Negative for pain and discharge.  Respiratory: Positive for cough. Negative for shortness of breath, wheezing and stridor.   Cardiovascular: Negative for chest pain.  Gastrointestinal: Negative for diarrhea, nausea and vomiting.  Genitourinary: Negative for frequency, hematuria and urgency.  Musculoskeletal: Negative for arthralgias and myalgias.  Skin: Negative for rash.  Neurological: Positive for headaches. Negative for dizziness, weakness and light-headedness.  Psychiatric/Behavioral: Negative for confusion and dysphoric mood.       Objective:   Physical Exam  Constitutional: She appears well-developed and well-nourished.  No distress.  Fatigued appearing   HENT:  Head: Normocephalic and atraumatic.  Right Ear: External ear normal.  Left Ear: External ear normal.  Mouth/Throat: Oropharynx is clear and moist.  Nares are injected and congested  No sinus tenderness Clear rhinorrhea and post nasal drip   Eyes: Conjunctivae and EOM are normal. Pupils are equal, round, and reactive to light. Right eye exhibits no discharge. Left eye exhibits no discharge.  Neck: Normal range of motion. Neck supple.  Cardiovascular: Normal rate and normal heart sounds.   Pulmonary/Chest: Effort  normal and breath sounds normal. No respiratory distress. She has no wheezes. She has no rales. She exhibits no tenderness.  Dry hacking cough  Lymphadenopathy:    She has no cervical adenopathy.  Neurological: She is alert.  Skin: Skin is warm and dry. No rash noted.  Psychiatric: She has a normal mood and affect.          Assessment & Plan:   Problem List Items Addressed This Visit      Respiratory   Influenza with respiratory manifestation    Fever started last night  Will px tamiflu 75 mg bid for 5 days Disc transmission- will wipe surfaces/wear a mask and no work until 2 days of no fever Disc symptomatic care - see instructions on AVS  Update if not starting to improve in a week or if worsening        Relevant Medications   oseltamivir (TAMIFLU) 75 MG capsule

## 2016-11-25 NOTE — Progress Notes (Signed)
Pre visit review using our clinic review tool, if applicable. No additional management support is needed unless otherwise documented below in the visit note. 

## 2017-03-18 LAB — OB RESULTS CONSOLE HIV ANTIBODY (ROUTINE TESTING): HIV: NONREACTIVE

## 2017-03-18 LAB — OB RESULTS CONSOLE GC/CHLAMYDIA
Chlamydia: NEGATIVE
Gonorrhea: NEGATIVE

## 2017-03-18 LAB — OB RESULTS CONSOLE RPR: RPR: NONREACTIVE

## 2017-03-18 LAB — OB RESULTS CONSOLE ABO/RH: RH TYPE: POSITIVE

## 2017-03-18 LAB — OB RESULTS CONSOLE ANTIBODY SCREEN: Antibody Screen: NEGATIVE

## 2017-03-18 LAB — OB RESULTS CONSOLE RUBELLA ANTIBODY, IGM: Rubella: IMMUNE

## 2017-03-18 LAB — OB RESULTS CONSOLE HEPATITIS B SURFACE ANTIGEN: Hepatitis B Surface Ag: NEGATIVE

## 2017-07-16 IMAGING — CR DG SHOULDER 2+V*L*
3 series · 3 of 3 positions shown · non-contrast
Comparison: None.

CLINICAL DATA: Anterior shoulder pain radiating to the chest and
left arm.

EXAM:
LEFT SHOULDER - 2+ VIEW

[view not recorded (1 of 3)]
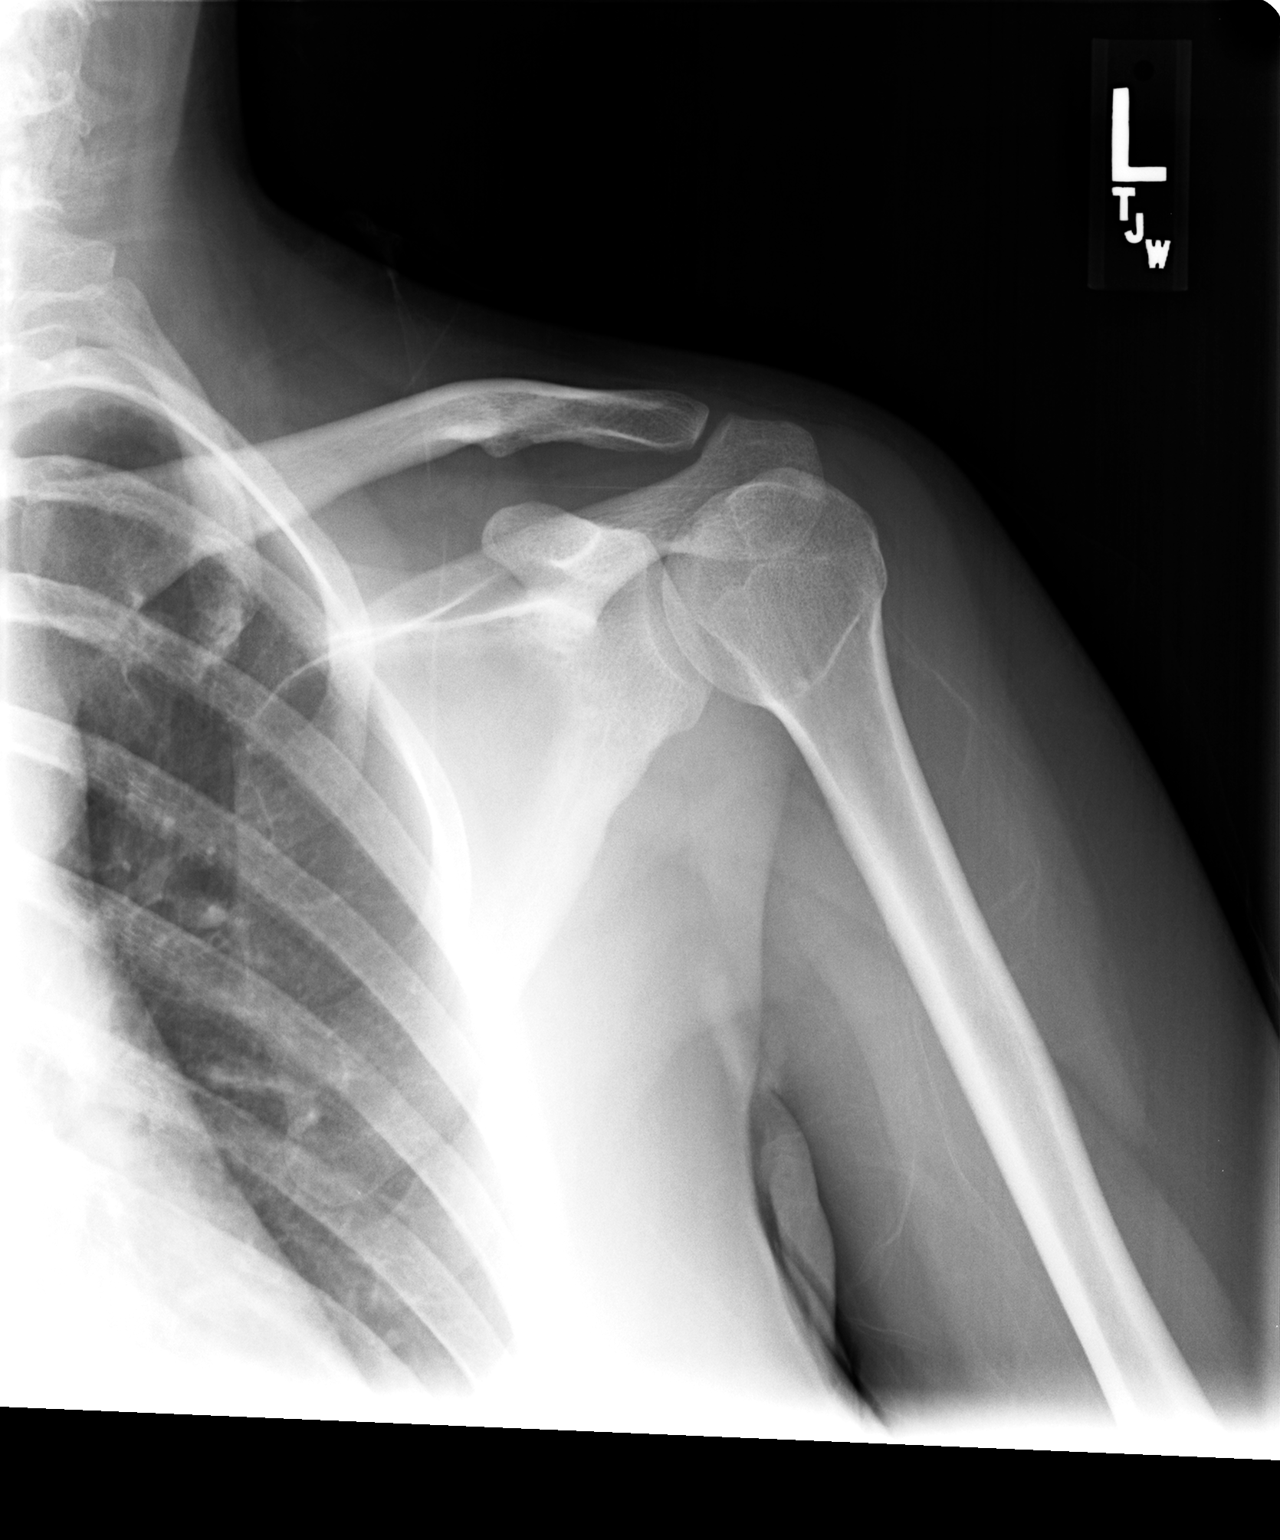

[view not recorded (2 of 3)]
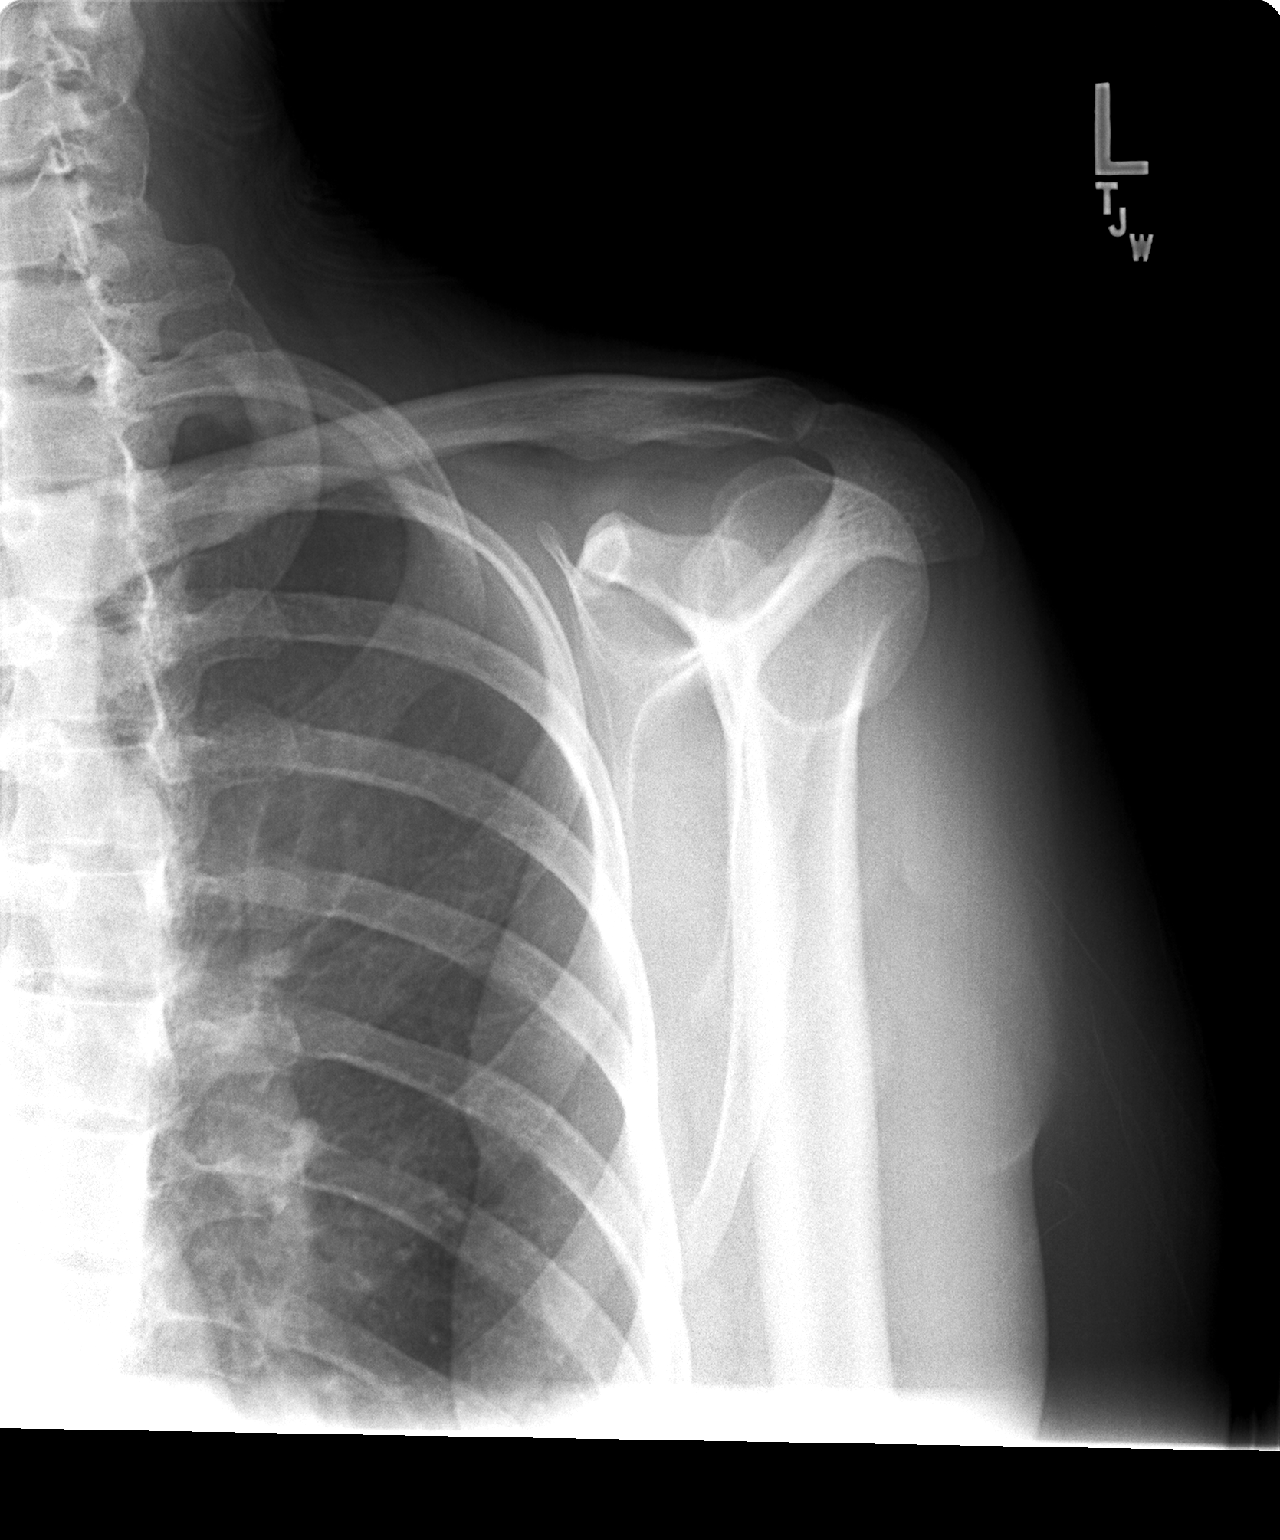

[view not recorded (3 of 3)]
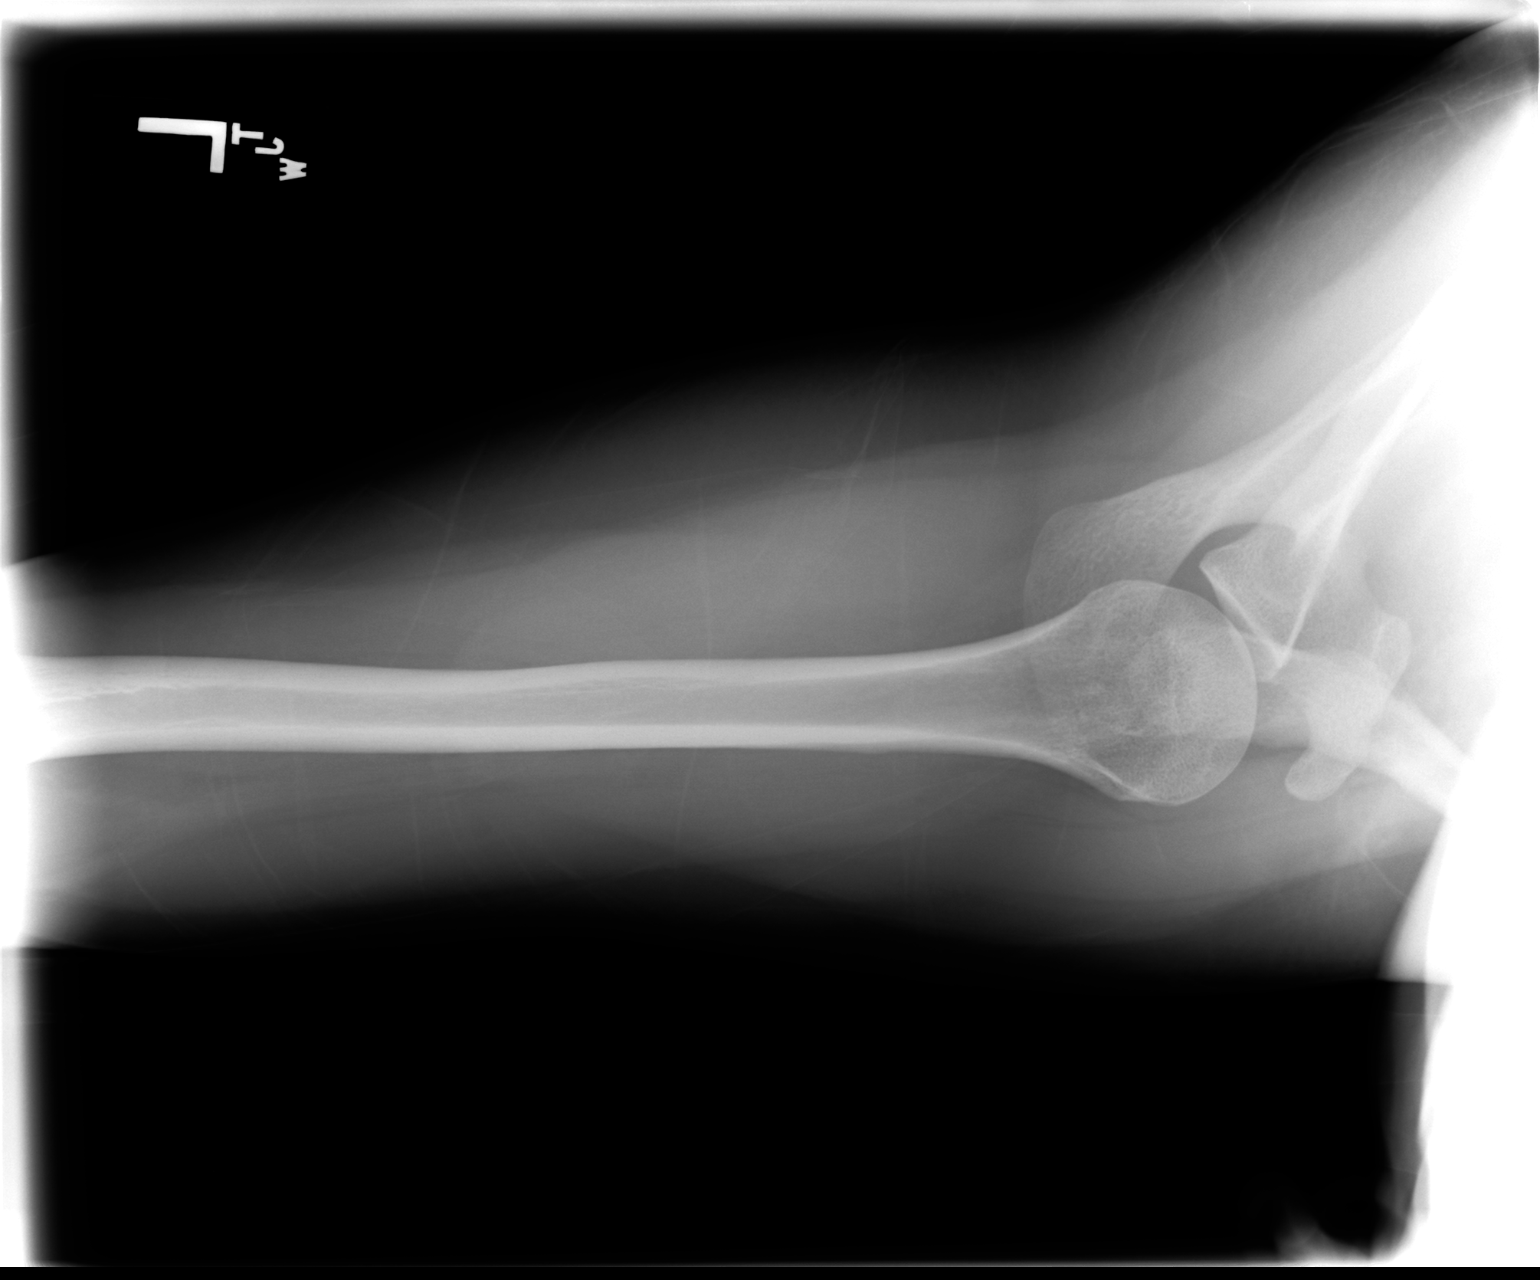

[3 of 3 positions shown; findings below may reference images not displayed]

FINDINGS: There is no evidence of fracture or dislocation. There is no
evidence of arthropathy or other focal bone abnormality. Soft
tissues are unremarkable.
IMPRESSION: Negative.

## 2017-07-16 IMAGING — CR DG CHEST 2V
2 series · 2 of 2 positions shown · non-contrast
Comparison: None in PACs

CLINICAL DATA: Chest pain, left shoulder pain, radiation of
symptoms into the left arm.

EXAM:
CHEST  2 VIEW

[view not recorded (1 of 2)]
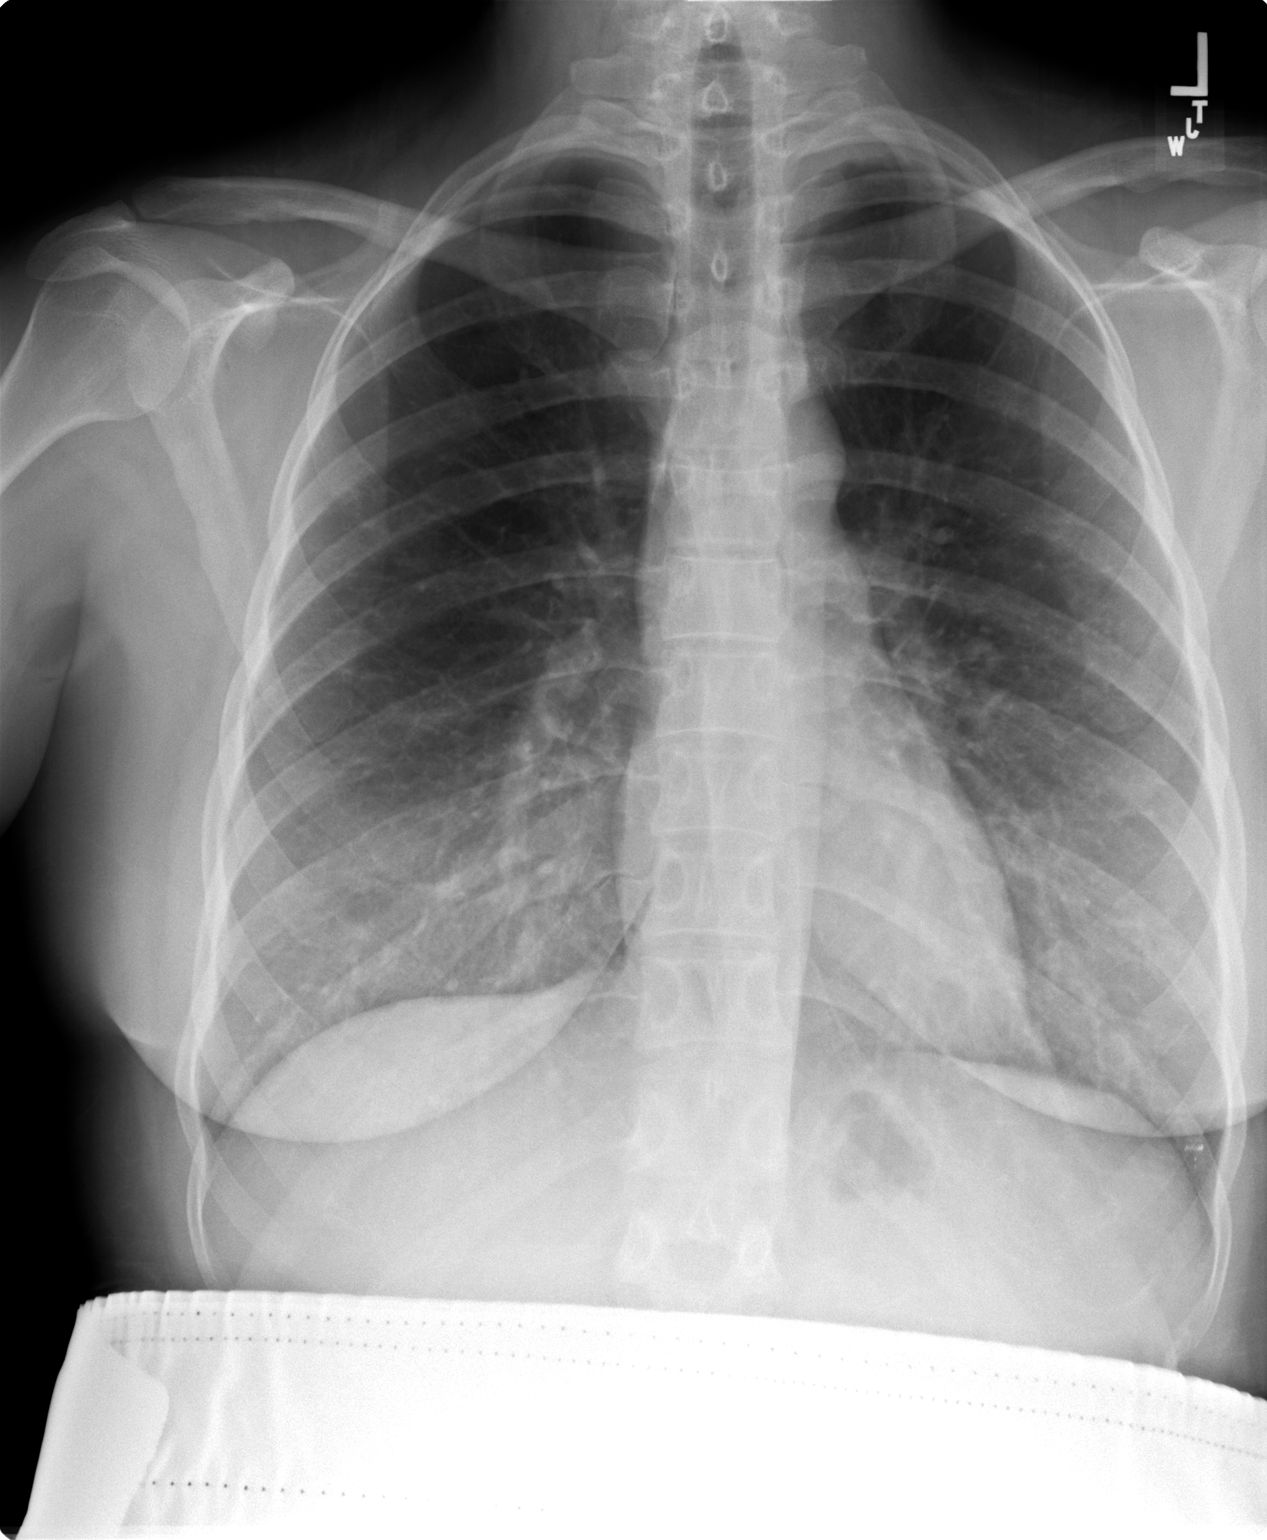

[view not recorded (2 of 2)]
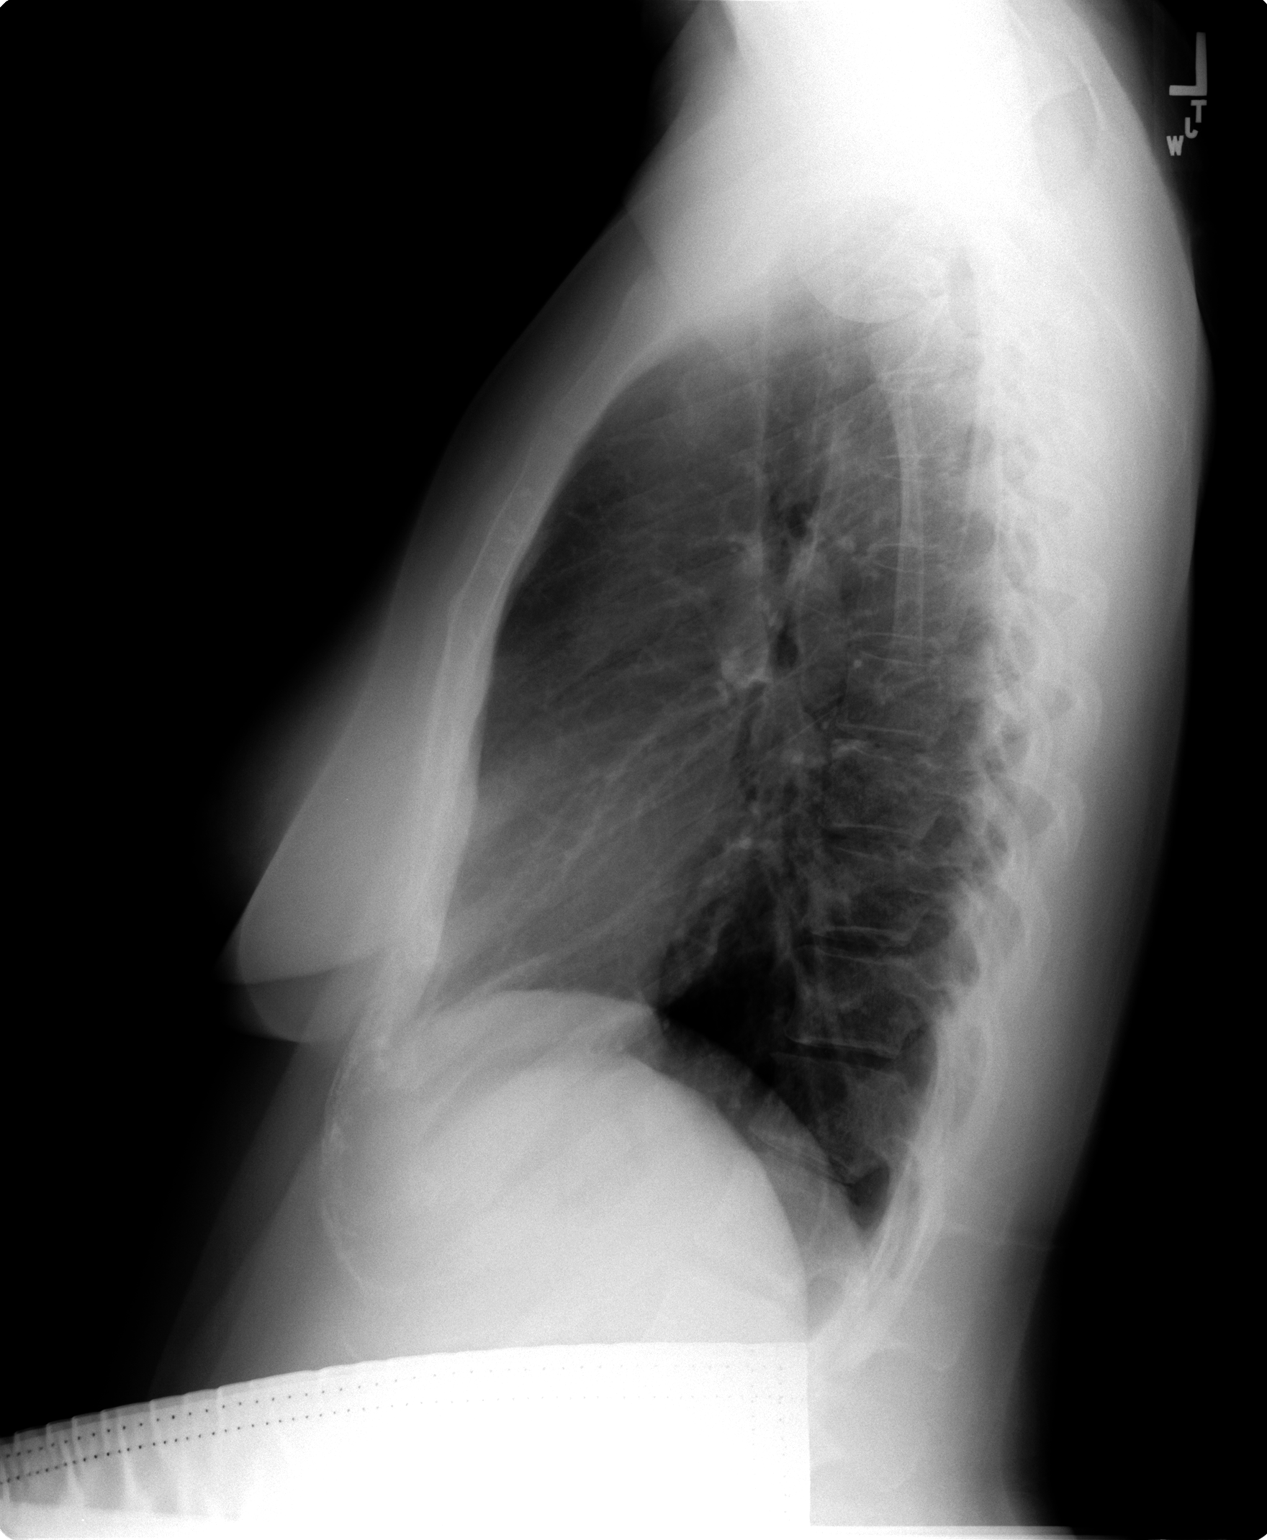

[2 of 2 positions shown; findings below may reference images not displayed]

FINDINGS: The lungs are well-expanded. There is no focal infiltrate. There is
no pleural effusion. The heart and pulmonary vascularity are normal.
The mediastinum is normal in width. There is no pleural effusion.
The bony thorax is unremarkable.
IMPRESSION: There is no active cardiopulmonary disease.

## 2017-09-02 ENCOUNTER — Other Ambulatory Visit: Payer: Self-pay | Admitting: Obstetrics and Gynecology

## 2017-09-11 LAB — OB RESULTS CONSOLE GBS: STREP GROUP B AG: NEGATIVE

## 2017-09-23 ENCOUNTER — Telehealth (HOSPITAL_COMMUNITY): Payer: Self-pay | Admitting: *Deleted

## 2017-09-23 ENCOUNTER — Encounter (HOSPITAL_COMMUNITY): Payer: Self-pay | Admitting: *Deleted

## 2017-09-23 NOTE — Telephone Encounter (Signed)
Preadmission screen  

## 2017-09-24 ENCOUNTER — Telehealth (HOSPITAL_COMMUNITY): Payer: Self-pay | Admitting: *Deleted

## 2017-09-24 NOTE — Telephone Encounter (Signed)
Preadmission screen  

## 2017-09-25 ENCOUNTER — Telehealth (HOSPITAL_COMMUNITY): Payer: Self-pay | Admitting: *Deleted

## 2017-09-25 NOTE — Telephone Encounter (Signed)
Preadmission screen  

## 2017-09-26 ENCOUNTER — Encounter (HOSPITAL_COMMUNITY): Payer: Self-pay

## 2017-09-30 NOTE — Patient Instructions (Addendum)
                                                     Breanna HashimotoKristen E Turner  10/01/2017   Your procedure is scheduled on:  10/02/2017  [facility].  Pick up the phone at the desk and dial [phone]  Call this number if you have problems the morning of [phone]  Remember:   Do not eat food:After Midnight.  Do not drink clear liquids: After Midnight.  Take these medicines the morning of surgery with A SIP OF WATER: none   Do not wear jewelry, make-up or nail polish.  Do not wear lotions, powders, or perfumes. Do not wear deodorant.  Do not shave 48 hours prior to surgery.  Do not bring valuables to the hospital.  [facility] is not   responsible for any belongings or valuables brought to the hospital.  Contacts, dentures or bridgework may not be worn into surgery.  Leave suitcase in the car. After surgery it may be brought to your room.  For patients admitted to the hospital, checkout time is 11:00 AM the day of              discharge.    N/A   Please read over the following fact sheets that you were given:   Surgical Site Infection Prevention

## 2017-10-01 ENCOUNTER — Encounter (HOSPITAL_COMMUNITY)
Admission: RE | Admit: 2017-10-01 | Discharge: 2017-10-01 | Disposition: A | Discharge status: Home / Self Care | Payer: BC Managed Care – PPO | Source: Ambulatory Visit | Attending: Obstetrics and Gynecology | Admitting: Obstetrics and Gynecology

## 2017-10-01 LAB — CBC
HCT: 38.9 % (ref 36.0–46.0)
HEMOGLOBIN: 13.1 g/dL (ref 12.0–15.0)
MCH: 32.3 pg (ref 26.0–34.0)
MCHC: 33.7 g/dL (ref 30.0–36.0)
MCV: 95.8 fL (ref 78.0–100.0)
PLATELETS: 193 10*3/uL (ref 150–400)
RBC: 4.06 MIL/uL (ref 3.87–5.11)
RDW: 13.6 % (ref 11.5–15.5)
WBC: 9 10*3/uL (ref 4.0–10.5)

## 2017-10-01 LAB — TYPE AND SCREEN
ABO/RH(D): O POS
ANTIBODY SCREEN: NEGATIVE

## 2017-10-02 ENCOUNTER — Encounter (HOSPITAL_COMMUNITY): Payer: Self-pay | Admitting: *Deleted

## 2017-10-02 ENCOUNTER — Encounter (HOSPITAL_COMMUNITY)
Admission: RE | Disposition: A | Discharge status: Home / Self Care | Payer: Self-pay | Source: Ambulatory Visit | Attending: Obstetrics and Gynecology

## 2017-10-02 ENCOUNTER — Inpatient Hospital Stay (HOSPITAL_COMMUNITY)
Admission: RE | Admit: 2017-10-02 | Discharge: 2017-10-04 | DRG: 788 | Disposition: A | Discharge status: Home / Self Care | Payer: BC Managed Care – PPO | Source: Ambulatory Visit | Attending: Obstetrics and Gynecology | Admitting: Obstetrics and Gynecology

## 2017-10-02 ENCOUNTER — Inpatient Hospital Stay (HOSPITAL_COMMUNITY): Payer: BC Managed Care – PPO | Admitting: Certified Registered Nurse Anesthetist

## 2017-10-02 DIAGNOSIS — K219 Gastro-esophageal reflux disease without esophagitis: Secondary | ICD-10-CM | POA: Diagnosis present

## 2017-10-02 DIAGNOSIS — O9962 Diseases of the digestive system complicating childbirth: Secondary | ICD-10-CM | POA: Diagnosis present

## 2017-10-02 DIAGNOSIS — Z3A39 39 weeks gestation of pregnancy: Secondary | ICD-10-CM | POA: Diagnosis not present

## 2017-10-02 DIAGNOSIS — O34211 Maternal care for low transverse scar from previous cesarean delivery: Principal | ICD-10-CM | POA: Diagnosis present

## 2017-10-02 LAB — RPR: RPR Ser Ql: NONREACTIVE

## 2017-10-02 SURGERY — Surgical Case
Anesthesia: Spinal

## 2017-10-02 MED ORDER — NALBUPHINE HCL 10 MG/ML IJ SOLN
5.0000 mg | Freq: Once | INTRAMUSCULAR | Status: AC
  Administered 2017-10-02: 5 mg via SUBCUTANEOUS

## 2017-10-02 MED ORDER — FENTANYL CITRATE (PF) 100 MCG/2ML IJ SOLN
INTRAMUSCULAR | Status: AC
  Filled 2017-10-02: qty 2

## 2017-10-02 MED ORDER — OXYTOCIN 40 UNITS IN LACTATED RINGERS INFUSION - SIMPLE MED: 2.5000 [IU]/h | INTRAVENOUS | Status: DC

## 2017-10-02 MED ORDER — CEFAZOLIN SODIUM-DEXTROSE 2-4 GM/100ML-% IV SOLN
2.0000 g | INTRAVENOUS | Status: AC
  Administered 2017-10-02: 2 g via INTRAVENOUS

## 2017-10-02 MED ORDER — METHYLERGONOVINE MALEATE 0.2 MG/ML IJ SOLN
0.2000 mg | Freq: Once | INTRAMUSCULAR | Status: AC
  Administered 2017-10-02: 0.2 mg via INTRAMUSCULAR

## 2017-10-02 MED ORDER — ZOLPIDEM TARTRATE 5 MG PO TABS: 5.0000 mg | ORAL_TABLET | Freq: Every evening | ORAL | Status: DC | PRN

## 2017-10-02 MED ORDER — DIBUCAINE 1 % RE OINT: 1.0000 "application " | TOPICAL_OINTMENT | RECTAL | Status: DC | PRN

## 2017-10-02 MED ORDER — ONDANSETRON HCL 4 MG/2ML IJ SOLN: 4.0000 mg | Freq: Once | INTRAMUSCULAR | Status: DC | PRN

## 2017-10-02 MED ORDER — LACTATED RINGERS IV SOLN
INTRAVENOUS | Status: DC
  Administered 2017-10-03: via INTRAVENOUS

## 2017-10-02 MED ORDER — PHENYLEPHRINE 8 MG IN D5W 100 ML (0.08MG/ML) PREMIX OPTIME
INJECTION | INTRAVENOUS | Status: DC | PRN
  Administered 2017-10-02: 60 ug/min via INTRAVENOUS

## 2017-10-02 MED ORDER — DEXAMETHASONE SODIUM PHOSPHATE 10 MG/ML IJ SOLN
INTRAMUSCULAR | Status: AC
  Filled 2017-10-02: qty 1

## 2017-10-02 MED ORDER — COCONUT OIL OIL: 1.0000 "application " | TOPICAL_OIL | Status: DC | PRN

## 2017-10-02 MED ORDER — METOCLOPRAMIDE HCL 5 MG/ML IJ SOLN
INTRAMUSCULAR | Status: AC
  Filled 2017-10-02: qty 2

## 2017-10-02 MED ORDER — DIPHENHYDRAMINE HCL 25 MG PO CAPS: 25.0000 mg | ORAL_CAPSULE | Freq: Four times a day (QID) | ORAL | Status: DC | PRN

## 2017-10-02 MED ORDER — OXYTOCIN 10 UNIT/ML IJ SOLN
INTRAVENOUS | Status: DC | PRN
  Administered 2017-10-02: 40 [IU] via INTRAVENOUS

## 2017-10-02 MED ORDER — ONDANSETRON HCL 4 MG/2ML IJ SOLN
INTRAMUSCULAR | Status: AC
  Filled 2017-10-02: qty 2

## 2017-10-02 MED ORDER — ACETAMINOPHEN 325 MG PO TABS: 650.0000 mg | ORAL_TABLET | ORAL | Status: DC | PRN

## 2017-10-02 MED ORDER — SIMETHICONE 80 MG PO CHEW: 80.0000 mg | CHEWABLE_TABLET | ORAL | Status: DC | PRN

## 2017-10-02 MED ORDER — MENTHOL 3 MG MT LOZG: 1.0000 | LOZENGE | OROMUCOSAL | Status: DC | PRN

## 2017-10-02 MED ORDER — TETANUS-DIPHTH-ACELL PERTUSSIS 5-2.5-18.5 LF-MCG/0.5 IM SUSP: 0.5000 mL | Freq: Once | INTRAMUSCULAR | Status: DC

## 2017-10-02 MED ORDER — FENTANYL CITRATE (PF) 100 MCG/2ML IJ SOLN
INTRAMUSCULAR | Status: DC | PRN
  Administered 2017-10-02: 25 ug via INTRATHECAL

## 2017-10-02 MED ORDER — FENTANYL CITRATE (PF) 100 MCG/2ML IJ SOLN
INTRAMUSCULAR | Status: AC
Start: 2017-10-02 — End: 2017-10-03
  Filled 2017-10-02: qty 2

## 2017-10-02 MED ORDER — PHENYLEPHRINE 40 MCG/ML (10ML) SYRINGE FOR IV PUSH (FOR BLOOD PRESSURE SUPPORT)
PREFILLED_SYRINGE | INTRAVENOUS | Status: AC
  Filled 2017-10-02: qty 10

## 2017-10-02 MED ORDER — LACTATED RINGERS IV SOLN
INTRAVENOUS | Status: DC | PRN
  Administered 2017-10-02: 13:00:00 via INTRAVENOUS

## 2017-10-02 MED ORDER — SIMETHICONE 80 MG PO CHEW
80.0000 mg | CHEWABLE_TABLET | ORAL | Status: DC
  Administered 2017-10-02 – 2017-10-03 (×2): 80 mg via ORAL
  Filled 2017-10-02 (×2): qty 1

## 2017-10-02 MED ORDER — ALBUMIN HUMAN 5 % IV SOLN
12.5000 g | Freq: Once | INTRAVENOUS | Status: AC
  Administered 2017-10-02: 12.5 g via INTRAVENOUS
  Filled 2017-10-02: qty 250

## 2017-10-02 MED ORDER — MORPHINE SULFATE (PF) 0.5 MG/ML IJ SOLN
INTRAMUSCULAR | Status: AC
  Filled 2017-10-02: qty 10

## 2017-10-02 MED ORDER — SODIUM CHLORIDE 0.9 % IR SOLN
Status: DC | PRN
  Administered 2017-10-02: 1000 mL

## 2017-10-02 MED ORDER — OXYTOCIN 10 UNIT/ML IJ SOLN
INTRAMUSCULAR | Status: AC
  Filled 2017-10-02: qty 4

## 2017-10-02 MED ORDER — PRENATAL MULTIVITAMIN CH
1.0000 | ORAL_TABLET | Freq: Every day | ORAL | Status: DC
  Administered 2017-10-03 – 2017-10-04 (×2): 1 via ORAL
  Filled 2017-10-02 (×2): qty 1

## 2017-10-02 MED ORDER — METOCLOPRAMIDE HCL 5 MG/ML IJ SOLN
INTRAMUSCULAR | Status: DC | PRN
  Administered 2017-10-02: 10 mg via INTRAVENOUS

## 2017-10-02 MED ORDER — BUPIVACAINE IN DEXTROSE 0.75-8.25 % IT SOLN
INTRATHECAL | Status: AC
  Filled 2017-10-02: qty 2

## 2017-10-02 MED ORDER — FENTANYL CITRATE (PF) 100 MCG/2ML IJ SOLN
25.0000 ug | INTRAMUSCULAR | Status: DC | PRN
  Administered 2017-10-02: 25 ug via INTRAVENOUS
  Administered 2017-10-02: 50 ug via INTRAVENOUS

## 2017-10-02 MED ORDER — METHYLERGONOVINE MALEATE 0.2 MG/ML IJ SOLN
INTRAMUSCULAR | Status: AC
  Filled 2017-10-02: qty 1

## 2017-10-02 MED ORDER — WITCH HAZEL-GLYCERIN EX PADS: 1.0000 "application " | MEDICATED_PAD | CUTANEOUS | Status: DC | PRN

## 2017-10-02 MED ORDER — ONDANSETRON HCL 4 MG/2ML IJ SOLN
INTRAMUSCULAR | Status: DC | PRN
  Administered 2017-10-02: 4 mg via INTRAVENOUS

## 2017-10-02 MED ORDER — BUPIVACAINE IN DEXTROSE 0.75-8.25 % IT SOLN
INTRATHECAL | Status: DC | PRN
  Administered 2017-10-02: 1.3 mL via INTRATHECAL

## 2017-10-02 MED ORDER — LIDOCAINE-EPINEPHRINE (PF) 2 %-1:200000 IJ SOLN
INTRAMUSCULAR | Status: AC
  Filled 2017-10-02: qty 20

## 2017-10-02 MED ORDER — MORPHINE SULFATE (PF) 0.5 MG/ML IJ SOLN
INTRAMUSCULAR | Status: DC | PRN
  Administered 2017-10-02: .2 mg via INTRATHECAL

## 2017-10-02 MED ORDER — PHENYLEPHRINE HCL 10 MG/ML IJ SOLN
INTRAMUSCULAR | Status: DC | PRN
  Administered 2017-10-02: 80 ug via INTRAVENOUS
  Administered 2017-10-02: 160 ug via INTRAVENOUS
  Administered 2017-10-02: 100 ug via INTRAVENOUS
  Administered 2017-10-02: 80 ug via INTRAVENOUS

## 2017-10-02 MED ORDER — NALBUPHINE HCL 10 MG/ML IJ SOLN
INTRAMUSCULAR | Status: AC
  Filled 2017-10-02: qty 1

## 2017-10-02 MED ORDER — DEXAMETHASONE SODIUM PHOSPHATE 10 MG/ML IJ SOLN
INTRAMUSCULAR | Status: DC | PRN
  Administered 2017-10-02: 10 mg via INTRAVENOUS

## 2017-10-02 MED ORDER — ALBUMIN HUMAN 5 % IV SOLN
INTRAVENOUS | Status: DC | PRN
  Administered 2017-10-02: 14:00:00 via INTRAVENOUS

## 2017-10-02 MED ORDER — IBUPROFEN 600 MG PO TABS
600.0000 mg | ORAL_TABLET | Freq: Four times a day (QID) | ORAL | Status: DC
  Administered 2017-10-02 – 2017-10-04 (×8): 600 mg via ORAL
  Filled 2017-10-02 (×8): qty 1

## 2017-10-02 MED ORDER — MEPERIDINE HCL 25 MG/ML IJ SOLN: 6.2500 mg | INTRAMUSCULAR | Status: DC | PRN

## 2017-10-02 MED ORDER — ALBUMIN HUMAN 5 % IV SOLN
INTRAVENOUS | Status: AC
  Filled 2017-10-02: qty 500

## 2017-10-02 MED ORDER — SIMETHICONE 80 MG PO CHEW
80.0000 mg | CHEWABLE_TABLET | Freq: Three times a day (TID) | ORAL | Status: DC
  Administered 2017-10-02 – 2017-10-04 (×4): 80 mg via ORAL
  Filled 2017-10-02 (×4): qty 1

## 2017-10-02 MED ORDER — SENNOSIDES-DOCUSATE SODIUM 8.6-50 MG PO TABS
2.0000 | ORAL_TABLET | ORAL | Status: DC
  Administered 2017-10-02 – 2017-10-03 (×2): 2 via ORAL
  Filled 2017-10-02 (×2): qty 2

## 2017-10-02 MED ORDER — LACTATED RINGERS IV SOLN
INTRAVENOUS | Status: DC | PRN
  Administered 2017-10-02 (×3): via INTRAVENOUS

## 2017-10-02 MED ORDER — PHENYLEPHRINE 8 MG IN D5W 100 ML (0.08MG/ML) PREMIX OPTIME
INJECTION | INTRAVENOUS | Status: AC
  Filled 2017-10-02: qty 100

## 2017-10-02 SURGICAL SUPPLY — 36 items
CHLORAPREP W/TINT 26ML (MISCELLANEOUS) ×3 IMPLANT
CLAMP CORD UMBIL (MISCELLANEOUS) IMPLANT
CLOTH BEACON ORANGE TIMEOUT ST (SAFETY) ×3 IMPLANT
DERMABOND ADHESIVE PROPEN (GAUZE/BANDAGES/DRESSINGS) ×2
DERMABOND ADVANCED (GAUZE/BANDAGES/DRESSINGS) ×2
DERMABOND ADVANCED .7 DNX12 (GAUZE/BANDAGES/DRESSINGS) ×1 IMPLANT
DERMABOND ADVANCED .7 DNX6 (GAUZE/BANDAGES/DRESSINGS) ×1 IMPLANT
DRSG OPSITE POSTOP 4X10 (GAUZE/BANDAGES/DRESSINGS) ×3 IMPLANT
ELECT REM PT RETURN 9FT ADLT (ELECTROSURGICAL) ×3
ELECTRODE REM PT RTRN 9FT ADLT (ELECTROSURGICAL) ×1 IMPLANT
EXTRACTOR VACUUM M CUP 4 TUBE (SUCTIONS) ×2 IMPLANT
EXTRACTOR VACUUM M CUP 4' TUBE (SUCTIONS) ×1
GLOVE BIO SURGEON STRL SZ7 (GLOVE) ×3 IMPLANT
GLOVE BIOGEL PI IND STRL 7.0 (GLOVE) ×1 IMPLANT
GLOVE BIOGEL PI INDICATOR 7.0 (GLOVE) ×2
GOWN STRL REUS W/TWL LRG LVL3 (GOWN DISPOSABLE) ×6 IMPLANT
KIT ABG SYR 3ML LUER SLIP (SYRINGE) IMPLANT
NEEDLE HYPO 22GX1.5 SAFETY (NEEDLE) IMPLANT
NEEDLE HYPO 25X5/8 SAFETYGLIDE (NEEDLE) IMPLANT
NS IRRIG 1000ML POUR BTL (IV SOLUTION) ×3 IMPLANT
PACK C SECTION WH (CUSTOM PROCEDURE TRAY) ×3 IMPLANT
PAD OB MATERNITY 4.3X12.25 (PERSONAL CARE ITEMS) ×3 IMPLANT
PENCIL SMOKE EVAC W/HOLSTER (ELECTROSURGICAL) ×3 IMPLANT
RETRACTOR TRAXI PANNICULUS (MISCELLANEOUS) ×1 IMPLANT
RTRCTR C-SECT PINK 25CM LRG (MISCELLANEOUS) ×3 IMPLANT
SUT CHROMIC 1 CTX 36 (SUTURE) ×9 IMPLANT
SUT CHROMIC 2 0 CT 1 (SUTURE) ×6 IMPLANT
SUT PDS AB 0 CTX 60 (SUTURE) ×3 IMPLANT
SUT PLAIN 2 0 XLH (SUTURE) ×3 IMPLANT
SUT VIC AB 2-0 CT1 27 (SUTURE) ×2
SUT VIC AB 2-0 CT1 TAPERPNT 27 (SUTURE) ×1 IMPLANT
SUT VIC AB 4-0 KS 27 (SUTURE) ×3 IMPLANT
SYR 30ML LL (SYRINGE) IMPLANT
TOWEL OR 17X24 6PK STRL BLUE (TOWEL DISPOSABLE) ×3 IMPLANT
TRAXI PANNICULUS RETRACTOR (MISCELLANEOUS) ×2
TRAY FOLEY BAG SILVER LF 14FR (SET/KITS/TRAYS/PACK) ×3 IMPLANT

## 2017-10-02 NOTE — Anesthesia Preprocedure Evaluation (Signed)
Anesthesia Evaluation  Patient identified by MRN, date of birth, ID band Patient awake    Reviewed: Allergy & Precautions, H&P , NPO status , Patient's Chart, lab work & pertinent test results  History of Anesthesia Complications Negative for: history of anesthetic complications  Airway Mallampati: II  TM Distance: >3 FB Neck ROM: Full    Dental  (+) Teeth Intact   Pulmonary neg pulmonary ROS,    Pulmonary exam normal        Cardiovascular negative cardio ROS   Rhythm:Regular Rate:Normal     Neuro/Psych negative neurological ROS  negative psych ROS   GI/Hepatic Neg liver ROS, GERD  ,  Endo/Other  negative endocrine ROS  Renal/GU negative Renal ROS     Musculoskeletal negative musculoskeletal ROS (+)   Abdominal   Peds  Hematology negative hematology ROS (+)   Anesthesia Other Findings   Reproductive/Obstetrics (+) Pregnancy                             Anesthesia Physical  Anesthesia Plan  ASA: II  Anesthesia Plan: Spinal   Post-op Pain Management:    Induction:   PONV Risk Score and Plan: 2 and Ondansetron and Scopolamine patch - Pre-op  Airway Management Planned: Natural Airway  Additional Equipment: None  Intra-op Plan:   Post-operative Plan:   Informed Consent: I have reviewed the patients History and Physical, chart, labs and discussed the procedure including the risks, benefits and alternatives for the proposed anesthesia with the patient or authorized representative who has indicated his/her understanding and acceptance.   Dental advisory given  Plan Discussed with: CRNA and Surgeon  Anesthesia Plan Comments:         Anesthesia Quick Evaluation

## 2017-10-02 NOTE — Transfer of Care (Signed)
Immediate Anesthesia Transfer of Care Note  Patient: [name]  Procedure(s) Performed: CESAREAN SECTION (N/A )  Patient Location: PACU  Anesthesia Type:Spinal  Level of Consciousness: awake, alert , oriented and patient cooperative  Airway & Oxygen Therapy: Patient Spontanous Breathing  Post-op Assessment: Report given to RN, Post -op Vital signs reviewed and stable and Post -op Vital signs reviewed and unstable, Anesthesiologist notified--BP low upon entry to PACU--vasopressors and Albumin given by CRNA; CRNA remains with patient until stable. Patient mentating appropriately. MDA called to make aware.  Post vital signs: Reviewed and stable  Last Vitals:  Vitals:   10/02/17 1021  BP: 118/66  Pulse: 76  Resp: 20    Last Pain:  Vitals:   10/02/17 1021  PainSc: 0-No pain         Complications: No apparent anesthesia complications

## 2017-10-02 NOTE — Anesthesia Postprocedure Evaluation (Signed)
Anesthesia Post Note  Patient: Breanna HashimotoKristen E Turner  Procedure(s) Performed: CESAREAN SECTION (N/A )     Patient location during evaluation: PACU Anesthesia Type: Spinal Level of consciousness: oriented and awake and alert Pain management: pain level controlled Vital Signs Assessment: post-procedure vital signs reviewed and stable Respiratory status: spontaneous breathing, respiratory function stable and patient connected to nasal cannula oxygen Cardiovascular status: blood pressure returned to baseline and stable Postop Assessment: no headache, no backache and no apparent nausea or vomiting Anesthetic complications: no    Last Vitals:  Vitals:   10/02/17 1355 10/02/17 1356  BP: 96/62   Pulse: 62 64  Resp: 13 13  Temp:    SpO2: 100% 100%    Last Pain:  Vitals:   10/02/17 1021  PainSc: 0-No pain   Pain Goal:                 [name]

## 2017-10-02 NOTE — Anesthesia Procedure Notes (Signed)
Spinal  Patient location during procedure: OR Start time: 10/02/2017 11:55 AM End time: 10/02/2017 12:01 PM Staffing Performed: other anesthesia staff  Preanesthetic Checklist Completed: patient identified, surgical consent, pre-op evaluation, timeout performed, IV checked, risks and benefits discussed and monitors and equipment checked Spinal Block Patient position: sitting Prep: ChloraPrep Patient monitoring: blood pressure, heart rate and continuous pulse ox Approach: midline Location: L4-5 Injection technique: single-shot Needle Needle type: Pencan  Needle gauge: 24 G Assessment Sensory level: T4

## 2017-10-02 NOTE — Op Note (Signed)
Pre-Operative Diagnosis: 1) 39+0 week intrauterine pregnancy 2) History of prior cesarean section x 2, desires repeat Postoperative Diagnosis: 1) 39+0 week intrauterine pregnancy 2) History of prior cesarean section x 2, desires repeat  Procedure: Repeat cesarean section with lysis of adhesions Surgeon: Dr. Waynard ReedsKendra Mitsy Owen Assistant: Dr. Ilda Moriichard Kaplan Operative Findings: Vigorous female infant in the vertex presentation with apgars of 9 at 1 and 9 at 5 minutes. Normal ovaries and tubes. Adhesive disease involving the anterior peritoneum and lower uterine segment Specimen: Placenta EBL: Total I/O In: 3100 [I.V.:3100] Out: 867 [Urine:100; Blood:767]   Procedure:[name] is an 31 year old gravida 3 para 2002 at 4039 weeks and 0 days estimated gestational age who presents for cesarean section. Following the appropriate informed consent the patient was brought to the operating room where spinal anesthesia was administered and found to be adequate. She was placed in the dorsal supine position with a leftward tilt. She was prepped and draped in the normal sterile fashion. Scalpel was then used to make a Pfannenstiel skin incision which was carried down to the underlying layers of soft tissue to the fascia. The fascia was incised in the midline and the fascial incision was extended laterally with Mayo scissors. The superior aspect of the fascial incision was grasped with Coker clamps x2, tented up and the rectus muscles dissected off sharply with the electrocautery unit area and the same procedure was repeated on the inferior aspect of the fascial incision. The rectus muscles were separated in the midline. The abdominal peritoneum was identified, tented up, entered sharply, and the incision was extended superiorly and inferiorly with good visualization of the bladder. The Alexis retractor was then deployed. The anterior Lower uterine segment had filmy and dense adhesions to the anterior abdominal wall. THese were  taken down with a combination of sharp and blunt dissection. The vesicouterine peritoneum was identified, tented up, entered sharply, and the bladder flap was created digitally. Scalpel was then used to make a low transverse incision on the uterus which was extended laterally with blunt dissection. The fetal vertex was identified, delivered easily through the uterine incision followed by the body. The infant was bulb suctioned on the operative field cried vigorously, cord was clamped and cut after a 1 minute delay. The infant was passed to the waiting neonatologist. Placenta was then delivered spontaneously, the uterus was cleared of all clot and debris. The uterine incision was repaired with #1 chromic in running locked fashion. Ovaries and tubes were inspected and normal. The Alexis retractor was removed. The uterus was returned to the abdominal cavity the abdominal cavity was cleared of all clot and debris. The abdominal peritoneum was reapproximated with 2-0 Vicryl in a running fashion, the rectus muscles was reapproximated with 2-0 chromic in a running fashion. The fascia was closed with a looped PDS in a running fashion. The subcutaneous tissue was reapproximated with interrupted sutures with 2-0 plain gut suture. The skin was closed with 4-0 vicryl in a subcuticular fashion and Dermabond. All sponge lap and needle counts were correct x2. Patient tolerated the procedure well and recovered in stable condition following the procedure.

## 2017-10-02 NOTE — H&P (Signed)
Breanna HashimotoKristen E Laflam is a 31 y.o. female presenting for repeat cesarean section OB History    Gravida Para Term Preterm AB Living   3 2 2  0 0 2   SAB TAB Ectopic Multiple Live Births   0 0 0 0 2     Past Medical History:  Diagnosis Date  . GERD (gastroesophageal reflux disease)    just with pregnancy; Tums help   . History of chicken pox   . History of hay fever   . Pneumonia 1992   pt was in first grade; treated   Past Surgical History:  Procedure Laterality Date  . CESAREAN SECTION  01/23/2012   Procedure: CESAREAN SECTION;  Surgeon: Freddrick MarchKendra H. Tenny Crawoss, MD;  Location: [facility];  Service: Gynecology;  Laterality: N/A;  . CESAREAN SECTION N/A 12/22/2013   Procedure: CESAREAN SECTION;  Surgeon: Freddrick MarchKendra H. Tenny Crawoss, MD;  Location: [facility];  Service: Obstetrics;  Laterality: N/A;  . WISDOM TOOTH EXTRACTION  1999   Family History: family history includes Alcohol abuse in her maternal grandfather; Diabetes in her maternal grandmother; Hypertension in her father, mother, and other; Other in her mother; Stroke in her maternal grandfather and other; Sudden death in her maternal uncle. Social History:  reports that  has never smoked. she has never used smokeless tobacco. She reports that she drinks alcohol. She reports that she does not use drugs.     Maternal Diabetes: No Genetic Screening: Normal Maternal Ultrasounds/Referrals: Normal Fetal Ultrasounds or other Referrals:  None Maternal Substance Abuse:  No Significant Maternal Medications:  None Significant Maternal Lab Results:  None Other Comments:  None  ROS History   Blood pressure 118/66, pulse 76, resp. rate 20, height 5' (1.524 m), weight 87.5 kg (193 lb), last menstrual period 01/02/2017, unknown if currently breastfeeding. Exam Physical Exam  Prenatal labs: ABO, Rh: --/--/O POS (12/05 1412) Antibody: NEG (12/05 1412) Rubella: Immune (05/22 0000) RPR: Non Reactive (12/05 1430)  HBsAg: Negative (05/22 0000)  HIV: Non-reactive (05/22  0000)  GBS: Negative (11/15 0000)   Assessment/Plan: 1) Admit 2) SCDs for DVT Prophylaxis 3) Proceed with cesarean   [name] 10/02/2017, 11:34 AM

## 2017-10-02 NOTE — Brief Op Note (Signed)
10/02/2017  1:18 PM  PATIENT:  [name]  31 y.o. female  PRE-OPERATIVE DIAGNOSIS:  REPEAT  POST-OPERATIVE DIAGNOSIS:  * No post-op diagnosis entered *  PROCEDURE:  Procedure(s): CESAREAN SECTION (N/A)  SURGEON:  Surgeon(s) and Role:    Waynard Reeds* Danille Oppedisano, MD - Primary  PHYSICIAN ASSISTANT:   ASSISTANTS: Dr. Ilda Moriichard Kaplan   ANESTHESIA:   spinal  EBL:  767 mL   BLOOD ADMINISTERED:none  DRAINS: Urinary Catheter (Foley)   LOCAL MEDICATIONS USED:  NONE  SPECIMEN:  Source of Specimen:  Placenta  DISPOSITION OF SPECIMEN:  Disposal  COUNTS:  YES  TOURNIQUET:  * No tourniquets in log *  DICTATION: .Dragon Dictation  PLAN OF CARE: Admit to inpatient   PATIENT DISPOSITION:  PACU - hemodynamically stable.   Delay start of Pharmacological VTE agent (>24hrs) due to surgical blood loss or risk of bleeding: not applicable

## 2017-10-03 LAB — CBC
HEMATOCRIT: 31.7 % — AB (ref 36.0–46.0)
HEMOGLOBIN: 10.8 g/dL — AB (ref 12.0–15.0)
MCH: 32.9 pg (ref 26.0–34.0)
MCHC: 34.1 g/dL (ref 30.0–36.0)
MCV: 96.6 fL (ref 78.0–100.0)
Platelets: 169 10*3/uL (ref 150–400)
RBC: 3.28 MIL/uL — ABNORMAL LOW (ref 3.87–5.11)
RDW: 13.9 % (ref 11.5–15.5)
WBC: 11.7 10*3/uL — AB (ref 4.0–10.5)

## 2017-10-03 LAB — BIRTH TISSUE RECOVERY COLLECTION (PLACENTA DONATION)

## 2017-10-03 MED ORDER — OXYCODONE-ACETAMINOPHEN 5-325 MG PO TABS
1.0000 | ORAL_TABLET | ORAL | Status: DC | PRN
  Administered 2017-10-03 – 2017-10-04 (×4): 1 via ORAL
  Filled 2017-10-03 (×4): qty 1

## 2017-10-03 NOTE — Lactation Note (Signed)
This note was copied from a baby's chart. Lactation Consultation Note  Patient Name: Girl Royetta CrochetKristen Ruddock JYNWG'NToday's Date: 10/03/2017 Reason for consult: Initial assessment;Term Breastfeeding consultation services and support information given and reviewed.  Mom BF her first two babies for 8 and 10 months.  Her goal is one year with newborn.  Baby is 3424 hours old and mom reports she has been feeding well.  Instructed on feeding with cues and calling for assist prn.  Maternal Data Does the patient have breastfeeding experience prior to this delivery?: Yes  Feeding Feeding Type: Breast Fed Length of feed: 55 min(on and off)  LATCH Score Latch: Grasps breast easily, tongue down, lips flanged, rhythmical sucking.  Audible Swallowing: Spontaneous and intermittent  Type of Nipple: Everted at rest and after stimulation  Comfort (Breast/Nipple): Soft / non-tender  Hold (Positioning): No assistance needed to correctly position infant at breast.  LATCH Score: 10  Interventions    Lactation Tools Discussed/Used     Consult Status Consult Status: Follow-up Date: 10/04/17 Follow-up type: In-patient    Huston FoleyMOULDEN, Katherleen Folkes S 10/03/2017, 12:50 PM

## 2017-10-03 NOTE — Plan of Care (Signed)
No respiratory complications 

## 2017-10-03 NOTE — Progress Notes (Signed)
Admission nutrition screen triggered for unintentional weight loss > 10 lbs within the last month. PNR indicates that there has been no weight loss . Patients chart reviewed and assessed  for nutritional risk. Patient is determined to be at low nutrition  risk.   Elisabeth CaraKatherine Isa Hitz M.Odis LusterEd. R.D. LDN Neonatal Nutrition Support Specialist/RD III Pager [phone]      Phone [phone]

## 2017-10-03 NOTE — Progress Notes (Signed)
Subjective: Postpartum Day 1: Cesarean Delivery Patient reports pain controlled, no nausea or vomiting. Pt does note a headache when she gets up . This seems to resolve when she sits back down. NO light headedness or dizziness  Objective: Vital signs in last 24 hours: Temp:  [97.7 F (36.5 C)-99 F (37.2 C)] 98.2 F (36.8 C) (12/07 0530) Pulse Rate:  [52-80] 74 (12/07 0530) Resp:  [12-26] 18 (12/07 0530) BP: (51-118)/(36-66) 90/48 (12/07 0530) SpO2:  [88 %-100 %] 97 % (12/07 0530) Weight:  [87.5 kg (193 lb)] 87.5 kg (193 lb) (12/06 1021)  Physical Exam:  General: alert, cooperative and appears stated age Lochia: appropriate Uterine Fundus: firm Incision: healing well DVT Evaluation: No evidence of DVT seen on physical exam.  Recent Labs    10/01/17 1430 10/03/17 0558  HGB 13.1 10.8*  HCT 38.9 31.7*    Assessment/Plan: Status post Cesarean section. Doing well postoperatively.  Continue current care.  Waynard ReedsKendra Florencia Zaccaro 10/03/2017, 10:03 AM

## 2017-10-03 NOTE — Progress Notes (Signed)
Dr Tenny Crawoss notified patient asking for something stronger then ibuprofen. See new orders.

## 2017-10-04 ENCOUNTER — Encounter (HOSPITAL_COMMUNITY): Payer: Self-pay | Admitting: Obstetrics and Gynecology

## 2017-10-04 MED ORDER — IBUPROFEN 600 MG PO TABS: 600.0000 mg | ORAL_TABLET | Freq: Four times a day (QID) | ORAL | 0 refills | Status: DC | PRN

## 2017-10-04 MED ORDER — OXYCODONE-ACETAMINOPHEN 5-325 MG PO TABS: 1.0000 | ORAL_TABLET | ORAL | 0 refills | Status: DC | PRN

## 2017-10-04 MED ORDER — DOCUSATE SODIUM 100 MG PO CAPS: 100.0000 mg | ORAL_CAPSULE | Freq: Two times a day (BID) | ORAL | 0 refills | Status: DC

## 2017-10-04 NOTE — Lactation Note (Addendum)
This note was copied from a baby's chart. Lactation Consultation Note  Patient Name: Breanna Royetta CrochetKristen Hilbert MVHQI'OToday's Date: 10/04/2017 Reason for consult: Follow-up assessment;Infant weight loss(8% weight loss , reweigt . slight decrease )  Baby is 48 hours old .  Per mom breast are fuller and heavier , and hearing more swallows, nipples alittle sore.  Sore nipple and engorgement prevention and tx reviewed.  LC instructed on the use of comfort gels, breast shells, hand pump.  Mother informed of post-discharge support and given phone number to the lactation department, including services for phone call assistance; out-patient appointments; and breastfeeding support group. List of other breastfeeding resources in the community given in the handout. Encouraged mother to call for problems or concerns related to breastfeeding.   Maternal Data    Feeding ( latch score is from RN )  Feeding Type: Breast Fed Length of feed: 30 min  LATCH Score Latch: Grasps breast easily, tongue down, lips flanged, rhythmical sucking.  Audible Swallowing: Spontaneous and intermittent  Type of Nipple: Everted at rest and after stimulation  Comfort (Breast/Nipple): Filling, red/small blisters or bruises, mild/mod discomfort(mild soreness; mom states worse yesterday)  Hold (Positioning): No assistance needed to correctly position infant at breast.  LATCH Score: 9  Interventions Interventions: Breast feeding basics reviewed  Lactation Tools Discussed/Used Tools: Shells;Comfort gels;Pump Shell Type: Inverted Breast pump type: Manual   Consult Status Consult Status: Complete Date: 10/04/17    Kathrin GreathouseMargaret Ann Jasmia Angst 10/04/2017, 12:42 PM

## 2017-10-04 NOTE — Discharge Instructions (Signed)

## 2017-10-04 NOTE — Discharge Summary (Signed)
Obstetric Discharge Summary Reason for Admission: cesarean section Prenatal Procedures: ultrasound Intrapartum Procedures: cesarean: low cervical, transverse Postpartum Procedures: none Complications-Operative and Postpartum: none Hemoglobin  Date Value Ref Range Status  10/03/2017 10.8 (L) 12.0 - 15.0 g/dL Final   HGB  Date Value Ref Range Status  11/17/2014 14.1 12.0 - 16.0 g/dL Final   HCT  Date Value Ref Range Status  10/03/2017 31.7 (L) 36.0 - 46.0 % Final  11/17/2014 42.7 35.0 - 47.0 % Final    Physical Exam:  General: alert, cooperative and appears stated age 4Lochia: appropriate Uterine Fundus: firm Incision: healing well DVT Evaluation: No evidence of DVT seen on physical exam.  Discharge Diagnoses: Term Pregnancy-delivered  Discharge Information: Date: 10/04/2017 Activity: pelvic rest Diet: routine Medications: Ibuprofen, Colace and Percocet Condition: improved Instructions: refer to practice specific booklet Discharge to: home Follow-up Information    [name], Breanna Fedewa, MD Follow up in 4 week(s).   Specialty:  Obstetrics and Gynecology Why:  For a postpartum evaluation Contact information: [address] [place] [address] [phone]           Newborn Data: Live born female  Birth Weight: 7 lb 15.5 oz (3615 g) APGAR: 8, 9  Newborn Delivery   Birth date/time:  10/02/2017 12:36:00 Delivery type:  C-Section, Low Transverse C-section categorization:  Repeat     Home with mother.  Breanna ReedsKendra Nawaal Turner 10/04/2017, 10:23 AM

## 2018-05-11 ENCOUNTER — Ambulatory Visit (INDEPENDENT_AMBULATORY_CARE_PROVIDER_SITE_OTHER): Payer: BLUE CROSS/BLUE SHIELD | Admitting: Family Medicine

## 2018-05-11 ENCOUNTER — Encounter: Payer: Self-pay | Admitting: Family Medicine

## 2018-05-11 VITALS — BP 106/64 | HR 65 | Temp 98.6°F | Ht 60.0 in | Wt 138.5 lb

## 2018-05-11 DIAGNOSIS — R519 Headache, unspecified: Secondary | ICD-10-CM | POA: Insufficient documentation

## 2018-05-11 DIAGNOSIS — R51 Headache: Secondary | ICD-10-CM | POA: Diagnosis not present

## 2018-05-11 NOTE — Patient Instructions (Addendum)
See if you can switch back to your previous mini pill  Make sure your obgyn knows about the headache   Keep eating healthy Drink lots of water  Start cutting caffeine by one drink per week  This week -one soda daily  Then stop   Get the best sleep you can  Keep doing the neck massages at night (also ice and heat)  Perhaps get a cervical support pillow ( foam)   You may be having rebound headaches from the over the counter medicine - do not take medicine if it does not help (excedrin migraine has caffeine may make you worse instead of better)   Let's refer you to the headache clinic

## 2018-05-11 NOTE — Assessment & Plan Note (Addendum)
Complicated by status of breast feeding  Suspect tension headaches that transition to migraines  Continue neck massage early in the day and use of ice/heat as well as invest in memory foam pillow  Disc analgesic rebound- may experience from her excedrin use  Also some caffeine effects  Disc inc water intake/ regular sleep habits  Did occur with change in her OC- she will inquire with her gyn / poss hormone component  In light of nursing /inability to take many medicines /will ref to the headache clinic  Handouts given If sudden worsening or constant HA - adv to alert me  >25 minutes spent in face to face time with patient, >50% spent in counselling or coordination of care -including a long discussion re: lifestyle change to prevent headahes

## 2018-05-11 NOTE — Progress Notes (Signed)
Subjective:    Patient ID: [name], female    DOB: 1986/10/14, 32 y.o.   MRN: [id number]  HPI Here for a headache lasting over 10 days  Wt Readings from Last 3 Encounters:  05/11/18 138 lb 8 oz (62.8 kg)  10/02/17 193 lb (87.5 kg)  09/26/17 190 lb (86.2 kg)   27.05 kg/m   Mom of 3 7 mo old  Up at night nursing   Started 2 weeks ago - come and go and getting worse in intensity   Take 400 mg if ibuprofen  Then bought excedrin for tension ha, then excedrin migraine  Help a little  Still every day   No hx of migraines  Mother had them   Headache features Changes places on head with some constant pain in back of head  Forehead/temples/cheeks- just depends  occ one sided  Throbbing  Not always worse with exertion  occ a little vision change (? Bit blurry)  No nausea except from medication  She is light sensitive  Headache comes on in the early afternoon - and then worst at bedtime and goes away when she sleeps   On a regular day 6-7 /10 on headache scale   Drinks lots fluids Drinks 2 diet dr peppers - in am -then water the rest of the day   She was on the mini pill for breast feeding  Changed brand  So then stopped it for a week- today is 7th day and it has made no difference  Had a baby in December and still nursing exclusively   Thinks she gets 9 hours of sleep per night- does nurse at least once   Diet changed in march- weight watchers  After she delivered - lost some  Lost 34 lb since starting wt watchers Not exercising - just eating healthy   Did get a neck massage- and then no headache the next day  It helps    Patient Active Problem List   Diagnosis Date Noted  . Chronic daily headache 05/11/2018  . Cesarean delivery, delivered, current hospitalization 10/02/2017  . Allergic rhinitis 08/16/2014  . Vaccine reaction 08/16/2014  . Cesarean delivery delivered 12/22/2013   Past Medical History:  Diagnosis Date  . GERD (gastroesophageal  reflux disease)    just with pregnancy; Tums help   . History of chicken pox   . History of hay fever   . Pneumonia 1992   pt was in first grade; treated   Past Surgical History:  Procedure Laterality Date  . CESAREAN SECTION  01/23/2012   Procedure: CESAREAN SECTION;  Surgeon: Freddrick March. Tenny Craw, MD;  Location: [facility];  Service: Gynecology;  Laterality: N/A;  . CESAREAN SECTION N/A 12/22/2013   Procedure: CESAREAN SECTION;  Surgeon: Freddrick March. Tenny Craw, MD;  Location: [facility];  Service: Obstetrics;  Laterality: N/A;  . CESAREAN SECTION N/A 10/02/2017   Procedure: CESAREAN SECTION;  Surgeon: Waynard Reeds, MD;  Location: [facility];  Service: Obstetrics;  Laterality: N/A;  . WISDOM TOOTH EXTRACTION  1999   Social History   Tobacco Use  . Smoking status: Never Smoker  . Smokeless tobacco: Never Used  Substance Use Topics  . Alcohol use: Yes    Alcohol/week: 0.0 oz    Comment: occasional  . Drug use: No   Family History  Problem Relation Age of Onset  . Alcohol abuse Maternal Grandfather   . Stroke Maternal Grandfather   . Hypertension Mother   . Other Mother  brain tumor benign  . Hypertension Father   . Hypertension Other        [employer]  . Stroke Other        [employer]  . Sudden death Maternal Uncle        brain aneurysm  . Diabetes Maternal Grandmother   . Colon cancer Neg Hx   . Colon polyps Neg Hx   . Heart disease Neg Hx   . Esophageal cancer Neg Hx   . Gallbladder disease Neg Hx    Allergies  Allergen Reactions  . Meningococcal And Conjugated Vaccines Shortness Of Breath and Other (See Comments)    Husband did not know reaction - he just knew she was allergic to it   Current Outpatient Medications on File Prior to Visit  Medication Sig Dispense Refill  . aspirin-acetaminophen-caffeine (EXCEDRIN MIGRAINE) 250-250-65 MG tablet Take 2 tablets by mouth every 6 (six) hours as needed for headache.    [name] 0.35 MG tablet Take 1 tablet by mouth daily.   10  . LECITHIN PO Take 2,400 mg by mouth daily.    . Prenatal Vit-Fe Fumarate-FA (PRENATAL ONE DAILY PO) Take 1 capsule by mouth daily.     No current facility-administered medications on file prior to visit.     Review of Systems  Constitutional: Positive for fatigue. Negative for activity change, appetite change, fever and unexpected weight change.  HENT: Negative for congestion, ear pain, rhinorrhea, sinus pressure and sore throat.   Eyes: Negative for pain, redness and visual disturbance.  Respiratory: Negative for cough, shortness of breath and wheezing.   Cardiovascular: Negative for chest pain and palpitations.  Gastrointestinal: Negative for abdominal pain, blood in stool, constipation and diarrhea.  Endocrine: Negative for polydipsia and polyuria.  Genitourinary: Negative for dysuria, frequency and urgency.  Musculoskeletal: Negative for arthralgias, back pain and myalgias.  Skin: Negative for pallor and rash.  Allergic/Immunologic: Negative for environmental allergies.  Neurological: Positive for headaches. Negative for dizziness, tremors, seizures, syncope, facial asymmetry, speech difficulty, weakness, light-headedness and numbness.  Hematological: Negative for adenopathy. Does not bruise/bleed easily.  Psychiatric/Behavioral: Negative for decreased concentration, dysphoric mood and sleep disturbance. The patient is not nervous/anxious.        Fragmented sleep from breast feeding but overall sleeps well        Objective:   Physical Exam  Constitutional: She is oriented to person, place, and time. She appears well-developed and well-nourished. No distress.  Well appearing   Weight loss noted   HENT:  Head: Normocephalic and atraumatic.  Right Ear: External ear normal.  Left Ear: External ear normal.  Nose: Nose normal.  Mouth/Throat: Oropharynx is clear and moist. No oropharyngeal exudate.  No sinus tenderness Nares are boggy No temporal tenderness  No TMJ  tenderness  Eyes: Pupils are equal, round, and reactive to light. Conjunctivae and EOM are normal. Right eye exhibits no discharge. Left eye exhibits no discharge. No scleral icterus.  No nystagmus  Neck: Normal range of motion and full passive range of motion without pain. Neck supple. No JVD present. Carotid bruit is not present. No tracheal deviation present. No thyromegaly present.  Cardiovascular: Normal rate, regular rhythm and normal heart sounds.  No murmur heard. Pulmonary/Chest: Effort normal and breath sounds normal. No respiratory distress. She has no wheezes. She has no rales.  Abdominal: Soft. Bowel sounds are normal. She exhibits no distension and no mass. There is no tenderness.  Musculoskeletal: She exhibits no edema or tenderness.  Lymphadenopathy:    She has no cervical adenopathy.  Neurological: She is alert and oriented to person, place, and time. She has normal strength and normal reflexes. She displays no atrophy, no tremor and normal reflexes. No cranial nerve deficit or sensory deficit. She exhibits normal muscle tone. She displays a negative Romberg sign. Coordination and gait normal.  No focal cerebellar signs   Skin: Skin is warm and dry. No rash noted. No erythema. No pallor.  Psychiatric: She has a normal mood and affect. Her behavior is normal. Thought content normal.  Bright affect          Assessment & Plan:   Problem List Items Addressed This Visit      Other   Chronic daily headache - Primary    Complicated by status of breast feeding  Suspect tension headaches that transition to migraines  Continue neck massage early in the day and use of ice/heat as well as invest in memory foam pillow  Disc analgesic rebound- may experience from her excedrin use  Also some caffeine effects  Disc inc water intake/ regular sleep habits  Did occur with change in her OC- she will inquire with her gyn / poss hormone component  In light of nursing /inability to take  many medicines /will ref to the headache clinic  Handouts given If sudden worsening or constant HA - adv to alert me  >25 minutes spent in face to face time with patient, >50% spent in counselling or coordination of care -including a long discussion re: lifestyle change to prevent headahes      Relevant Medications   aspirin-acetaminophen-caffeine (EXCEDRIN MIGRAINE) 250-250-65 MG tablet   Other Relevant Orders   Ambulatory referral to Neurology

## 2018-05-25 ENCOUNTER — Telehealth: Payer: Self-pay

## 2018-05-25 NOTE — Telephone Encounter (Signed)
[facility] will also route to Dr. Milinda Antisower as an Lorain ChildesFYI

## 2018-05-25 NOTE — Telephone Encounter (Signed)
Thanks for letting me know- hope the headaches are doing better

## 2018-05-25 NOTE — Telephone Encounter (Signed)
Copied from CRM [phone]. Topic: Referral - Question >> May 25, 2018  1:19 PM Oneal GroutSebastian, Jennifer S wrote: Reason for CRM: Patient has decided that she will not be using the [facility], would like to cancel referral.

## 2018-05-25 NOTE — Telephone Encounter (Signed)
Called [facility] and cancelled the Referral.

## 2018-08-06 DIAGNOSIS — Z6826 Body mass index (BMI) 26.0-26.9, adult: Secondary | ICD-10-CM | POA: Diagnosis not present

## 2018-08-06 DIAGNOSIS — N6459 Other signs and symptoms in breast: Secondary | ICD-10-CM | POA: Diagnosis not present

## 2018-08-14 ENCOUNTER — Ambulatory Visit (INDEPENDENT_AMBULATORY_CARE_PROVIDER_SITE_OTHER): Payer: BLUE CROSS/BLUE SHIELD

## 2018-08-14 ENCOUNTER — Ambulatory Visit: Payer: BLUE CROSS/BLUE SHIELD

## 2018-08-14 DIAGNOSIS — Z23 Encounter for immunization: Secondary | ICD-10-CM | POA: Diagnosis not present

## 2018-10-01 ENCOUNTER — Telehealth: Payer: Self-pay | Admitting: Family Medicine

## 2018-10-01 NOTE — Telephone Encounter (Signed)
Pt called to schedule an appt. In the middle of scheduling the appt the phone got disconnected. Had pt scheduled for 12/17 at 9am but she wants to be seen 12/10. Before I could get the date and time changed the phone disconnected. I did call the pt back twice and lvm.

## 2018-10-02 ENCOUNTER — Ambulatory Visit: Payer: BLUE CROSS/BLUE SHIELD | Admitting: Family Medicine

## 2018-10-06 ENCOUNTER — Ambulatory Visit (INDEPENDENT_AMBULATORY_CARE_PROVIDER_SITE_OTHER): Payer: BLUE CROSS/BLUE SHIELD | Admitting: Family Medicine

## 2018-10-06 ENCOUNTER — Encounter: Payer: Self-pay | Admitting: Family Medicine

## 2018-10-06 VITALS — BP 112/68 | HR 74 | Temp 97.9°F | Ht 60.0 in | Wt 141.0 lb

## 2018-10-06 DIAGNOSIS — R102 Pelvic and perineal pain: Secondary | ICD-10-CM | POA: Diagnosis not present

## 2018-10-06 DIAGNOSIS — M545 Low back pain, unspecified: Secondary | ICD-10-CM

## 2018-10-06 DIAGNOSIS — N912 Amenorrhea, unspecified: Secondary | ICD-10-CM | POA: Diagnosis not present

## 2018-10-06 DIAGNOSIS — R300 Dysuria: Secondary | ICD-10-CM

## 2018-10-06 LAB — POC URINALSYSI DIPSTICK (AUTOMATED)
BILIRUBIN UA: NEGATIVE
Blood, UA: NEGATIVE
Glucose, UA: NEGATIVE
KETONES UA: NEGATIVE
LEUKOCYTES UA: NEGATIVE
NITRITE UA: NEGATIVE
PROTEIN UA: NEGATIVE
Spec Grav, UA: 1.02 (ref 1.010–1.025)
Urobilinogen, UA: 0.2 E.U./dL
pH, UA: 6 (ref 5.0–8.0)

## 2018-10-06 LAB — POCT URINE PREGNANCY: Preg Test, Ur: NEGATIVE

## 2018-10-06 NOTE — Progress Notes (Signed)
Subjective:    Patient ID: Breanna HashimotoKristen E Turner, female    DOB: 02/14/1986, 32 y.o.   MRN: [id number]  HPI Here for back pain for 3 days  Wt Readings from Last 3 Encounters:  10/06/18 141 lb (64 kg)  05/11/18 138 lb 8 oz (62.8 kg)  10/02/17 193 lb (87.5 kg)   27.54 kg/m   Has had lower back pain that goes to left and right   (radiates to the legs-worse on the left- upper ant thighs)  Some cramping (pelvic area) on the left side  Bloated  In am- she is nauseated  Feels a little shaky / weak feeling at times   Micah FlesherWent for massage Helped a bit   She is breast feeding    Ibuprofen helps a bit   Took a pregnancy test - neg 2 weeks ago  Taking norethindrone -no missed doses  No vaginal bleeding  No d/c or odor   Last night-burned to urinate   Results for orders placed or performed in visit on 10/06/18  POCT urine pregnancy  Result Value Ref Range   Preg Test, Ur Negative Negative  POCT Urinalysis Dipstick (Automated)  Result Value Ref Range   Color, UA yellow    Clarity, UA clear    Glucose, UA Negative Negative   Bilirubin, UA neg    Ketones, UA neg    Spec Grav, UA 1.020 1.010 - 1.025   Blood, UA neg    pH, UA 6.0 5.0 - 8.0   Protein, UA Negative Negative   Urobilinogen, UA 0.2 0.2 or 1.0 E.U./dL   Nitrite, UA neg    Leukocytes, UA Negative Negative     Patient Active Problem List   Diagnosis Date Noted  . Pelvic pain 10/06/2018  . Dysuria 10/06/2018  . Amenorrhea 10/06/2018  . Chronic daily headache 05/11/2018  . Cesarean delivery, delivered, current hospitalization 10/02/2017  . Low back pain 11/21/2014  . Allergic rhinitis 08/16/2014  . Vaccine reaction 08/16/2014  . Cesarean delivery delivered 12/22/2013   Past Medical History:  Diagnosis Date  . GERD (gastroesophageal reflux disease)    just with pregnancy; Tums help   . History of chicken pox   . History of hay fever   . Pneumonia 1992   pt was in first grade; treated   Past Surgical History:    Procedure Laterality Date  . CESAREAN SECTION  01/23/2012   Procedure: CESAREAN SECTION;  Surgeon: Freddrick MarchKendra H. Tenny Crawoss, MD;  Location: [facility];  Service: Gynecology;  Laterality: N/A;  . CESAREAN SECTION N/A 12/22/2013   Procedure: CESAREAN SECTION;  Surgeon: Freddrick MarchKendra H. Tenny Crawoss, MD;  Location: [facility];  Service: Obstetrics;  Laterality: N/A;  . CESAREAN SECTION N/A 10/02/2017   Procedure: CESAREAN SECTION;  Surgeon: Waynard Reedsoss, Kendra, MD;  [facility];  Service: Obstetrics;  Laterality: N/A;  . WISDOM TOOTH EXTRACTION  1999   Social History   Tobacco Use  . Smoking status: Never Smoker  . Smokeless tobacco: Never Used  Substance Use Topics  . Alcohol use: Yes    Alcohol/week: 0.0 standard drinks    Comment: occasional  . Drug use: No   Family History  Problem Relation Age of Onset  . Alcohol abuse Maternal Grandfather   . Stroke Maternal Grandfather   . Hypertension Mother   . Other Mother        brain tumor benign  . Hypertension Father   . Hypertension Other        [place]  Uncle  . Stroke Other        [employer]  . Sudden death Maternal Uncle        brain aneurysm  . Diabetes Maternal Grandmother   . Colon cancer Neg Hx   . Colon polyps Neg Hx   . Heart disease Neg Hx   . Esophageal cancer Neg Hx   . Gallbladder disease Neg Hx    Allergies  Allergen Reactions  . Meningococcal And Conjugated Vaccines Shortness Of Breath and Other (See Comments)    Husband did not know reaction - he just knew she was allergic to it   Current Outpatient Medications on File Prior to Visit  Medication Sig Dispense Refill  . aspirin-acetaminophen-caffeine (EXCEDRIN MIGRAINE) 250-250-65 MG tablet Take 2 tablets by mouth every 6 (six) hours as needed for headache.    [name] 0.35 MG tablet Take 1 tablet by mouth daily.  10  . LECITHIN PO Take 2,400 mg by mouth daily.    . Prenatal Vit-Fe Fumarate-FA (PRENATAL ONE DAILY PO) Take 1 capsule by mouth daily.     No current  facility-administered medications on file prior to visit.     Review of Systems  Constitutional: Negative for activity change, appetite change, fatigue, fever and unexpected weight change.  HENT: Negative for congestion, ear pain, rhinorrhea, sinus pressure and sore throat.   Eyes: Negative for pain, redness and visual disturbance.  Respiratory: Negative for cough, shortness of breath and wheezing.   Cardiovascular: Negative for chest pain and palpitations.  Gastrointestinal: Negative for abdominal pain, blood in stool, constipation and diarrhea.  Endocrine: Negative for polydipsia and polyuria.  Genitourinary: Positive for pelvic pain. Negative for dysuria, frequency and urgency.  Musculoskeletal: Positive for back pain. Negative for arthralgias, gait problem and myalgias.  Skin: Negative for pallor and rash.  Allergic/Immunologic: Negative for environmental allergies.  Neurological: Negative for dizziness, syncope, weakness, numbness and headaches.  Hematological: Negative for adenopathy. Does not bruise/bleed easily.  Psychiatric/Behavioral: Negative for decreased concentration and dysphoric mood. The patient is not nervous/anxious.        Objective:   Physical Exam  Constitutional: She appears well-developed and well-nourished. No distress.  Well appearing   HENT:  Head: Normocephalic and atraumatic.  Mouth/Throat: Oropharynx is clear and moist.  Eyes: Pupils are equal, round, and reactive to light. Conjunctivae and EOM are normal.  Neck: Normal range of motion. Neck supple. No JVD present. Carotid bruit is not present. No thyromegaly present.  Cardiovascular: Normal rate, regular rhythm, normal heart sounds and intact distal pulses. Exam reveals no gallop.  Pulmonary/Chest: Effort normal and breath sounds normal. No stridor. No respiratory distress. She has no wheezes. She has no rales.  No crackles  Abdominal: Soft. Bowel sounds are normal. She exhibits no distension, no  abdominal bruit and no mass. There is no tenderness. There is no rebound and no guarding. No hernia.  Mild suprapubic tenderness w/o mass or fullness  No rebound or guarding  No cva tenderness  Musculoskeletal: She exhibits no edema.       Lumbar back: She exhibits normal range of motion, no tenderness, no bony tenderness, no edema, no deformity and no spasm.  Nl SLR and gait  No neuro changes   Lymphadenopathy:    She has no cervical adenopathy.  Neurological: She is alert. She has normal reflexes. No cranial nerve deficit. Coordination normal.  Skin: Skin is warm and dry. No rash noted.  No rash  Psychiatric: She has a normal  mood and affect.  Pleasant           Assessment & Plan:   Problem List Items Addressed This Visit      Other   Pelvic pain - Primary    With low back pain  On progesterone contraception /also breast feeding Some nausea  Neg ua and urine pregnancy test  Reassuring exam Plans to f/u with her obgyn       Low back pain    Not positional and nl rom on exam  Also pelvic pain/bloating  Neg UA and urine preg test today   Recommended f/u with obgyn - ? If gyn cause  Adv if worse or if she exp positional/muscular pain to let us know       Dysuria    UA clear Enc good fluid intake  Also pelvic /low back pain  Recommend f/u with gyn       Relevant Orders   POCT Urinalysis Dipstick (Automated) (Completed)   Amenorrhea    u preg test negative  On progesterone only contraception  Also breast feeding Has had some nausea and pelvic pain lately       Relevant Orders   POCT urine pregnancy (Completed)

## 2018-10-06 NOTE — Assessment & Plan Note (Signed)
Not positional and nl rom on exam  Also pelvic pain/bloating  Neg UA and urine preg test today   Recommended f/u with obgyn - ? If gyn cause  Adv if worse or if she exp positional/muscular pain to let us know

## 2018-10-06 NOTE — Assessment & Plan Note (Addendum)
u preg test negative  On progesterone only contraception  Also breast feeding Has had some nausea and pelvic pain lately

## 2018-10-06 NOTE — Assessment & Plan Note (Signed)
With low back pain  On progesterone contraception Orinda Kenner/also breast feeding Some nausea  Neg ua and urine pregnancy test  Reassuring exam Plans to f/u with her obgyn

## 2018-10-06 NOTE — Patient Instructions (Addendum)
Let's get a urinalysis and culture if needed I will also run a urine pregnancy test   Use heat on your back and lot abdomen Ibuprofen if it helps   If all is normal- follow up with your gyn  If no improvement in back pain we can also do a lumbar spine xray in the future (just let me know)   Take care of yourself

## 2018-10-06 NOTE — Assessment & Plan Note (Signed)
UA clear Enc good fluid intake  Also pelvic /low back pain  Recommend f/u with gyn

## 2018-10-07 DIAGNOSIS — Z6825 Body mass index (BMI) 25.0-25.9, adult: Secondary | ICD-10-CM | POA: Diagnosis not present

## 2018-10-07 DIAGNOSIS — R102 Pelvic and perineal pain: Secondary | ICD-10-CM | POA: Diagnosis not present

## 2018-10-07 DIAGNOSIS — R5383 Other fatigue: Secondary | ICD-10-CM | POA: Diagnosis not present

## 2018-10-13 ENCOUNTER — Ambulatory Visit: Payer: BLUE CROSS/BLUE SHIELD | Admitting: Family Medicine

## 2019-01-01 ENCOUNTER — Encounter: Payer: Self-pay | Admitting: Internal Medicine

## 2019-01-01 ENCOUNTER — Ambulatory Visit: Payer: BLUE CROSS/BLUE SHIELD | Admitting: Internal Medicine

## 2019-01-01 VITALS — BP 110/70 | HR 78 | Temp 99.2°F | Wt 143.0 lb

## 2019-01-01 DIAGNOSIS — B9689 Other specified bacterial agents as the cause of diseases classified elsewhere: Secondary | ICD-10-CM

## 2019-01-01 DIAGNOSIS — J019 Acute sinusitis, unspecified: Secondary | ICD-10-CM

## 2019-01-01 MED ORDER — AMOXICILLIN 500 MG PO CAPS: 500.0000 mg | ORAL_CAPSULE | Freq: Three times a day (TID) | ORAL | 0 refills | Status: DC

## 2019-01-01 NOTE — Progress Notes (Signed)
HPI  Pt presents to the clinic today with c/o headache, facial pain and pressure, nasal congestion and cough. She reports this started 5 days ago. Her headache is located in her forehead. She describes the pain as pressure. She is not able to blow anything out of her nose. The cough if non productive. She has run fever up to 100.7 , had chills and body aches. She has tried Ibuprofen and Dayquil/Nyquil with minimal relief. She has not had sick contacts.  Review of Systems     Past Medical History:  Diagnosis Date  . GERD (gastroesophageal reflux disease)    just with pregnancy; Tums help   . History of chicken pox   . History of hay fever   . Pneumonia 1992   pt was in first grade; treated    Family History  Problem Relation Age of Onset  . Alcohol abuse Maternal Grandfather   . Stroke Maternal Grandfather   . Hypertension Mother   . Other Mother        brain tumor benign  . Hypertension Father   . Hypertension Other        [employer]  . Stroke Other        [employer]  . Sudden death Maternal Uncle        brain aneurysm  . Diabetes Maternal Grandmother   . Colon cancer Neg Hx   . Colon polyps Neg Hx   . Heart disease Neg Hx   . Esophageal cancer Neg Hx   . Gallbladder disease Neg Hx     Social History   Socioeconomic History  . Marital status: Married    Spouse name: Not on file  . Number of children: 2  . Years of education: Not on file  . Highest education level: Not on file  Occupational History  . Occupation: Runner, broadcasting/film/video  Social Needs  . Financial resource strain: Not on file  . Food insecurity:    Worry: Not on file    Inability: Not on file  . Transportation needs:    Medical: Not on file    Non-medical: Not on file  Tobacco Use  . Smoking status: Never Smoker  . Smokeless tobacco: Never Used  Substance and Sexual Activity  . Alcohol use: Yes    Alcohol/week: 0.0 standard drinks    Comment: occasional  . Drug use: No  . Sexual activity: Yes   Lifestyle  . Physical activity:    Days per week: Not on file    Minutes per session: Not on file  . Stress: Not on file  Relationships  . Social connections:    Talks on phone: Not on file    Gets together: Not on file    Attends religious service: Not on file    Active member of club or organization: Not on file    Attends meetings of clubs or organizations: Not on file    Relationship status: Not on file  . Intimate partner violence:    Fear of current or ex partner: Not on file    Emotionally abused: Not on file    Physically abused: Not on file    Forced sexual activity: Not on file  Other Topics Concern  . Not on file  Social History Narrative  . Not on file    Allergies  Allergen Reactions  . Meningococcal And Conjugated Vaccines Shortness Of Breath and Other (See Comments)    Husband did not know reaction - he just knew  she was allergic to it     Constitutional: Positive headache, fatigue and fever. Denies abrupt weight changes.  HEENT:  Positive facial pain, nasal congestion. Denies eye redness, ear pain, ringing in the ears, wax buildup, runny nose or sore throat. Respiratory: Positive cough. Denies difficulty breathing or shortness of breath.  Cardiovascular: Denies chest pain, chest tightness, palpitations or swelling in the hands or feet.   No other specific complaints in a complete review of systems (except as listed in HPI above).  Objective:   BP 110/70   Pulse 78   Temp 99.2 F (37.3 C) (Oral)   Wt 143 lb (64.9 kg)   SpO2 98%   BMI 27.93 kg/m   General: Appears herstated age, well developed, well nourished in NAD. HEENT: Head: normal shape and size, frontal and maxillary sinus tenderness noted; Ears: Tm's gray and intact, normal light reflex; Nose: mucosa boggy and moist, septum midline; Throat/Mouth: + PND. Teeth present, mucosa pink and moist, no exudate noted, no lesions or ulcerations noted.  Neck:  No adenopathy noted.  Cardiovascular:  Normal rate and rhythm. S1,S2 noted.  No murmur, rubs or gallops noted.  Pulmonary/Chest: Normal effort and diminished vesicular breath sounds. No respiratory distress. No wheezes, rales or ronchi noted.       Assessment & Plan:   Acute Bacterial Sinusitis  Can use a Neti Pot which can be purchased from your local drug store. Flonase 2 sprays each nostril for 3 days and then as needed. eRx for Amoxil TID for 10 days  RTC as needed or if symptoms persist. Nicki Reaper, NP

## 2019-01-01 NOTE — Patient Instructions (Signed)

## 2019-01-04 ENCOUNTER — Ambulatory Visit: Payer: BLUE CROSS/BLUE SHIELD | Admitting: Family Medicine

## 2019-06-23 DIAGNOSIS — Z6830 Body mass index (BMI) 30.0-30.9, adult: Secondary | ICD-10-CM | POA: Diagnosis not present

## 2019-06-23 DIAGNOSIS — Z124 Encounter for screening for malignant neoplasm of cervix: Secondary | ICD-10-CM | POA: Diagnosis not present

## 2019-06-23 DIAGNOSIS — Z01419 Encounter for gynecological examination (general) (routine) without abnormal findings: Secondary | ICD-10-CM | POA: Diagnosis not present

## 2020-09-06 ENCOUNTER — Ambulatory Visit (INDEPENDENT_AMBULATORY_CARE_PROVIDER_SITE_OTHER): Payer: No Typology Code available for payment source

## 2020-09-06 DIAGNOSIS — Z23 Encounter for immunization: Secondary | ICD-10-CM | POA: Diagnosis not present

## 2023-10-15 ENCOUNTER — Ambulatory Visit: Payer: No Typology Code available for payment source | Admitting: Family

## 2023-10-28 ENCOUNTER — Ambulatory Visit: Payer: No Typology Code available for payment source | Admitting: Family

## 2024-01-29 ENCOUNTER — Ambulatory Visit: Payer: No Typology Code available for payment source | Admitting: Family

## 2024-05-24 ENCOUNTER — Encounter: Payer: Self-pay | Admitting: Family

## 2024-05-24 ENCOUNTER — Ambulatory Visit: Admitting: Family

## 2024-05-24 VITALS — BP 134/80 | HR 63 | Temp 98.1°F | Ht 60.0 in | Wt 203.0 lb

## 2024-05-24 DIAGNOSIS — Z789 Other specified health status: Secondary | ICD-10-CM | POA: Diagnosis not present

## 2024-05-24 DIAGNOSIS — Z111 Encounter for screening for respiratory tuberculosis: Secondary | ICD-10-CM | POA: Diagnosis not present

## 2024-05-24 DIAGNOSIS — Z0000 Encounter for general adult medical examination without abnormal findings: Secondary | ICD-10-CM | POA: Diagnosis not present

## 2024-05-24 DIAGNOSIS — J301 Allergic rhinitis due to pollen: Secondary | ICD-10-CM | POA: Diagnosis not present

## 2024-05-24 DIAGNOSIS — Z13220 Encounter for screening for lipoid disorders: Secondary | ICD-10-CM

## 2024-05-24 LAB — COMPREHENSIVE METABOLIC PANEL WITH GFR
ALT: 7 U/L (ref 0–35)
AST: 10 U/L (ref 0–37)
Albumin: 3.8 g/dL (ref 3.5–5.2)
Alkaline Phosphatase: 54 U/L (ref 39–117)
BUN: 15 mg/dL (ref 6–23)
CO2: 30 meq/L (ref 19–32)
Calcium: 9.6 mg/dL (ref 8.4–10.5)
Chloride: 103 meq/L (ref 96–112)
Creatinine, Ser: 0.75 mg/dL (ref 0.40–1.20)
GFR: 100.84 mL/min (ref >60.00)
Glucose, Bld: 91 mg/dL (ref 70–99)
Potassium: 4.4 meq/L (ref 3.5–5.1)
Sodium: 140 meq/L (ref 135–145)
Total Bilirubin: 0.4 mg/dL (ref 0.2–1.2)
Total Protein: 6.6 g/dL (ref 6.0–8.3)

## 2024-05-24 LAB — CBC
HCT: 43.4 % (ref 36.0–46.0)
Hemoglobin: 14.6 g/dL (ref 12.0–15.0)
MCHC: 33.7 g/dL (ref 30.0–36.0)
MCV: 88.5 fl (ref 78.0–100.0)
Platelets: 240 K/uL (ref 150.0–400.0)
RBC: 4.9 Mil/uL (ref 3.87–5.11)
RDW: 13.8 % (ref 11.5–15.5)
WBC: 7.2 K/uL (ref 4.0–10.5)

## 2024-05-24 LAB — LIPID PANEL
Cholesterol: 170 mg/dL (ref 0–200)
HDL: 57.5 mg/dL (ref >39.00)
LDL Cholesterol: 76 mg/dL (ref 0–99)
NonHDL: 112.06
Total CHOL/HDL Ratio: 3
Triglycerides: 182 mg/dL — ABNORMAL HIGH (ref 0.0–149.0)
VLDL: 36.4 mg/dL (ref 0.0–40.0)

## 2024-05-24 NOTE — Assessment & Plan Note (Signed)
 Continue allegra

## 2024-05-24 NOTE — Progress Notes (Signed)
 Subjective:  Patient ID: [name], female    DOB: 04-19-1986  Age: 38 y.o. MRN: [id number]  Patient Care Team: Corwin Antu, FNP as PCP - General (Family Medicine)   CC:  Chief Complaint  Patient presents with   Establish Care    HPI [name] is a 38 y.o. female who presents today to establish care and for an annual physical exam. She reports consuming a general diet. Trying to work on a routine but does admit to rarely exercising She generally feels well. She reports sleeping well. She does not have additional problems to discuss today.   Vision:Not within last year Dental:Receives regular dental care :  Last pap: 03/2024 negative , every give years with her gynecologist   Pt is without acute concerns.   Advanced Directives Patient does not have advanced directives  Completed TB risk questionnaire as follows:  Were you born outside [place]  in [place], [place], [place], [place] or [place]? No.  Have you traveled outside of [place]  and lived for more than one month? No.   Do you have a compromised immune system such as from the following: HIV/AIDs, organ or bone  marrow transplant, diabetes, immunosuppressive medications, leukemia, lymphoma, cancer of head or neck, gastrectomy, or jejunal bypass, end stage renal disease on dialysis or silicosis? No.  Have you ever done one of the following? Used crack cocaine, injected illegal drugs, worked or resided in jail or prison, worked or resided at a homeless shelter, or worked as a Research scientist (physical sciences) in Careers information officer with patients? No.  Have you ever been exposed to anyone with infectious tb? No.  Completed TB symptom questionnaire as follows:   Do you currently have any of the following symptoms?  Unexplained cough more than 3 weeks? No. Unexplained fever lasting more than 3 weeks? No. Night sweats, that are leaving bedclothes and sheets wet? No. Shortness of breath? No. Chest pain? No. Unintentional  weight loss? No. Unexplained fatigued? No.     DEPRESSION SCREENING    04/15/2016    4:01 PM  PHQ 2/9 Scores  PHQ - 2 Score 0     ROS: Negative unless specifically indicated above in HPI.    Current Outpatient Medications:    escitalopram (LEXAPRO) 10 MG tablet, Take 10 mg by mouth daily., Disp: , Rfl:    fexofenadine (ALLEGRA) 180 MG tablet, Take 180 mg by mouth daily., Disp: , Rfl:    norgestimate-ethinyl estradiol (ORTHO-CYCLEN) 0.25-35 MG-MCG tablet, Take 1 tablet by mouth daily., Disp: , Rfl:     Objective:    BP 134/80 (BP Location: Left Arm, Patient Position: Sitting, Cuff Size: Large)   Pulse 63   Temp 98.1 F (36.7 C) (Temporal)   Ht 5' (1.524 m)   Wt 203 lb (92.1 kg)   LMP 04/27/2024 (Approximate)   SpO2 98%   Breastfeeding No   BMI 39.65 kg/m   BP Readings from Last 3 Encounters:  05/24/24 134/80  01/01/19 110/70  10/06/18 112/68      Physical Exam Constitutional:      General: She is not in acute distress.    Appearance: Normal appearance. She is obese. She is not ill-appearing.  HENT:     Head: Normocephalic.     Right Ear: Tympanic membrane normal.     Left Ear: Tympanic membrane normal.     Nose: Nose normal.     Mouth/Throat:     Mouth: Mucous membranes are moist.  Eyes:  Extraocular Movements: Extraocular movements intact.     Pupils: Pupils are equal, round, and reactive to light.  Cardiovascular:     Rate and Rhythm: Normal rate and regular rhythm.  Pulmonary:     Effort: Pulmonary effort is normal.     Breath sounds: Normal breath sounds.  Abdominal:     General: Abdomen is flat. Bowel sounds are normal.     Palpations: Abdomen is soft.     Tenderness: There is no guarding or rebound.  Musculoskeletal:        General: Normal range of motion.     Cervical back: Normal range of motion.  Skin:    General: Skin is warm.     Capillary Refill: Capillary refill takes less than 2 seconds.  Neurological:     General: No focal  deficit present.     Mental Status: She is alert.  Psychiatric:        Mood and Affect: Mood normal.        Behavior: Behavior normal.        Thought Content: Thought content normal.        Judgment: Judgment normal.          Assessment & Plan:  Seasonal allergic rhinitis due to pollen Assessment & Plan: Continue allegra    Encounter for general adult medical examination without abnormal findings Assessment & Plan: Patient Counseling(The following topics were reviewed):  Preventative care handout given to pt  Health maintenance and immunizations reviewed. Please refer to Health maintenance section. Pt advised on safe sex, wearing seatbelts in car, and proper nutrition labwork ordered today for annual Dental health: Discussed importance of regular tooth brushing, flossing, and dental visits.   Orders: -     Lipid panel -     CBC -     Comprehensive metabolic panel with GFR -     TSH  Screening for lipoid disorders -     Lipid panel  Measles, mumps, rubella (MMR) vaccination status unknown -     Measles/Mumps/Rubella Immunity  Screening for tuberculosis -     TB Skin Test   Pt unsure if she completed 3 dose HIB, four dose pneumonococcal, four dose poio, MMR (will obtain titer today) last td 2017 per records.      Follow-up: Return in about 1 year (around 05/24/2025) for f/u CPE.   Ginger Patrick, FNP

## 2024-05-24 NOTE — Assessment & Plan Note (Signed)

## 2024-05-25 ENCOUNTER — Telehealth: Payer: Self-pay | Admitting: *Deleted

## 2024-05-25 ENCOUNTER — Ambulatory Visit: Payer: Self-pay | Admitting: Family

## 2024-05-25 DIAGNOSIS — Z789 Other specified health status: Secondary | ICD-10-CM

## 2024-05-25 LAB — TSH: TSH: 1.91 u[IU]/mL (ref 0.35–5.50)

## 2024-05-25 NOTE — Progress Notes (Signed)
 Cholesterol looks great.  Labs normal range.

## 2024-05-25 NOTE — Telephone Encounter (Signed)
 Copied from CRM [id number]. Topic: General - Other >> May 25, 2024 11:46 AM Rosina BIRCH wrote: Reason for CRM: patient returned a call and I read the lab results to her

## 2024-05-26 ENCOUNTER — Ambulatory Visit (INDEPENDENT_AMBULATORY_CARE_PROVIDER_SITE_OTHER)

## 2024-05-26 DIAGNOSIS — Z111 Encounter for screening for respiratory tuberculosis: Secondary | ICD-10-CM

## 2024-05-26 LAB — TB SKIN TEST
Induration: 0 mm
TB Skin Test: NEGATIVE

## 2024-05-26 NOTE — Progress Notes (Signed)
 Your PPD was negative. MMR not immune will need MMR booster, can schedule nurse visit.  Paperwork in outbox. Make copy then pt can pick up.

## 2024-05-26 NOTE — Progress Notes (Signed)
 PPD Reading Note PPD read and results entered in [employer]. Result: 0 mm induration. Interpretation: negative If test not read within 48-72 hours of initial placement, patient advised to repeat in other arm 1-3 weeks after this test. Allergic reaction: no   Pt is waiting on titer results and will wait to hear those results; unable to find form to enter PPD reading as negative at Lindsay's or Tabitha's desk and pt will pick up after gets call from titer results.

## 2024-05-26 NOTE — Progress Notes (Signed)
 [name],   Unfortunately your MMR is without immunity meaning you would have to get the MMR vaccination. You can schedule a nurse visit and get this booster at your lesiure , just give the office a call to schedule

## 2024-05-27 LAB — MEASLES/MUMPS/RUBELLA IMMUNITY
Mumps IgG: <9 [AU]/ml — ABNORMAL LOW
Rubella: <0.9 {index} — ABNORMAL LOW
Rubeola IgG: <13.5 [AU]/ml — ABNORMAL LOW

## 2024-05-27 LAB — TEST AUTHORIZATION

## 2024-05-27 LAB — VARICELLA ZOSTER ANTIBODY, IGG: Varicella IgG: 5.69 {s_co_ratio}

## 2024-05-28 ENCOUNTER — Telehealth: Payer: Self-pay

## 2024-05-28 NOTE — Telephone Encounter (Signed)
 Spoke to pt. Advised her the form was up front ready for pickup.

## 2024-05-28 NOTE — Progress Notes (Signed)
 You are immune to varicella just not to MMR.  As we discussed previously she'll just need to schedule for the MMR vacciantion

## 2024-05-31 NOTE — Telephone Encounter (Signed)
 Pt picked up forms.
